# Patient Record
Sex: Female | Born: 1965 | Race: Black or African American | Hispanic: No | Marital: Single | Smoking: Current every day smoker
Health system: Southern US, Community
[De-identification: ages and names within clinical notes are randomized; demographics above are authoritative.]

## PROBLEM LIST (undated history)

## (undated) DIAGNOSIS — K589 Irritable bowel syndrome without diarrhea: Secondary | ICD-10-CM

## (undated) HISTORY — PX: CHOLECYSTECTOMY: SHX55

---

## 2015-01-10 ENCOUNTER — Encounter (HOSPITAL_BASED_OUTPATIENT_CLINIC_OR_DEPARTMENT_OTHER): Payer: Self-pay | Admitting: *Deleted

## 2015-01-10 ENCOUNTER — Emergency Department (HOSPITAL_BASED_OUTPATIENT_CLINIC_OR_DEPARTMENT_OTHER): Payer: Managed Care, Other (non HMO)

## 2015-01-10 ENCOUNTER — Emergency Department (HOSPITAL_BASED_OUTPATIENT_CLINIC_OR_DEPARTMENT_OTHER)
Admission: EM | Admit: 2015-01-10 | Discharge: 2015-01-10 | Disposition: A | Discharge status: Home / Self Care | Payer: Managed Care, Other (non HMO) | Attending: Emergency Medicine | Admitting: Emergency Medicine

## 2015-01-10 DIAGNOSIS — Y998 Other external cause status: Secondary | ICD-10-CM | POA: Diagnosis not present

## 2015-01-10 DIAGNOSIS — S3992XA Unspecified injury of lower back, initial encounter: Secondary | ICD-10-CM | POA: Insufficient documentation

## 2015-01-10 DIAGNOSIS — Y92410 Unspecified street and highway as the place of occurrence of the external cause: Secondary | ICD-10-CM | POA: Insufficient documentation

## 2015-01-10 DIAGNOSIS — Y9389 Activity, other specified: Secondary | ICD-10-CM | POA: Insufficient documentation

## 2015-01-10 DIAGNOSIS — S199XXA Unspecified injury of neck, initial encounter: Secondary | ICD-10-CM | POA: Insufficient documentation

## 2015-01-10 DIAGNOSIS — M542 Cervicalgia: Secondary | ICD-10-CM

## 2015-01-10 MED ORDER — IBUPROFEN 800 MG PO TABS
800.0000 mg | ORAL_TABLET | Freq: Once | ORAL | Status: AC
  Administered 2015-01-10: 800 mg via ORAL
  Filled 2015-01-10: qty 1

## 2015-01-10 MED ORDER — IBUPROFEN 800 MG PO TABS: 800.0000 mg | ORAL_TABLET | Freq: Once | ORAL | Status: DC

## 2015-01-10 MED ORDER — HYDROCODONE-ACETAMINOPHEN 5-325 MG PO TABS: 1.0000 | ORAL_TABLET | Freq: Four times a day (QID) | ORAL | Status: DC | PRN

## 2015-01-10 NOTE — Discharge Instructions (Signed)
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
## 2015-01-10 NOTE — ED Notes (Signed)
Patient transported to X-ray via stretcher, sr x 2 up, warm blanket provided

## 2015-01-10 NOTE — ED Notes (Signed)
MD at bedside. 

## 2018-01-04 ENCOUNTER — Encounter (HOSPITAL_BASED_OUTPATIENT_CLINIC_OR_DEPARTMENT_OTHER): Payer: Self-pay

## 2018-01-04 ENCOUNTER — Emergency Department (HOSPITAL_BASED_OUTPATIENT_CLINIC_OR_DEPARTMENT_OTHER): Payer: 59

## 2018-01-04 ENCOUNTER — Emergency Department (HOSPITAL_BASED_OUTPATIENT_CLINIC_OR_DEPARTMENT_OTHER)
Admission: EM | Admit: 2018-01-04 | Discharge: 2018-01-04 | Disposition: A | Discharge status: Home / Self Care | Payer: 59 | Attending: Emergency Medicine | Admitting: Emergency Medicine

## 2018-01-04 ENCOUNTER — Other Ambulatory Visit: Payer: Self-pay

## 2018-01-04 DIAGNOSIS — J0100 Acute maxillary sinusitis, unspecified: Secondary | ICD-10-CM

## 2018-01-04 DIAGNOSIS — R0981 Nasal congestion: Secondary | ICD-10-CM | POA: Diagnosis present

## 2018-01-04 HISTORY — DX: Irritable bowel syndrome, unspecified: K58.9

## 2018-01-04 MED ORDER — FLUTICASONE PROPIONATE 50 MCG/ACT NA SUSP: 1.0000 | Freq: Every day | NASAL | 0 refills | Status: DC

## 2018-01-04 MED ORDER — AMOXICILLIN-POT CLAVULANATE 875-125 MG PO TABS: 1.0000 | ORAL_TABLET | Freq: Two times a day (BID) | ORAL | 0 refills | Status: DC

## 2018-01-04 MED FILL — AMOX-CLAV 875-125 MG TABLET: 875-125 | 7 days supply | Qty: 14 | Fill #0

## 2018-01-04 MED FILL — SM ALLERGY RELIEF 50 MCG SP: 50 MCG | 59 days supply | Qty: 16 | Fill #0

## 2018-01-04 NOTE — ED Provider Notes (Signed)
[facility] EMERGENCY DEPARTMENT Provider Note   CSN: [id number] Arrival date & time: 01/04/18  1255     History   Chief Complaint Chief Complaint  Patient presents with  . URI    HPI Karen Mcdowell is a 52 y.o. female with a hx of IBS and cholecystectomy who presents to the ED with complaints of URI sxs x 1-2 weeks. Patient states she is having congestion, rhinorrhea, L sided sinus pain/pressure, fever with temp max of 101, chills, and mild dry cough.  Symptoms are constant.  States she has tried over-the-counter sinus medicines without relief.  No specific alleviating or aggravating factors.  Denies ear pain, sore throat, difficulty breathing, rashes, or recent tick exposures/wooded activity.  HPI  Past Medical History:  Diagnosis Date  . IBS (irritable bowel syndrome)     There are no active problems to display for this patient.   Past Surgical History:  Procedure Laterality Date  . CHOLECYSTECTOMY       OB History   None      Home Medications    Prior to Admission medications   Medication Sig Start Date End Date Taking? Authorizing Provider  HYDROcodone-acetaminophen (NORCO/VICODIN) 5-325 MG per tablet Take 1 tablet by mouth every 6 (six) hours as needed for moderate pain. 01/10/15   Elwin MochaWalden, Blair, MD  ibuprofen (ADVIL,MOTRIN) 800 MG tablet Take 1 tablet (800 mg total) by mouth once. 01/10/15   Elwin MochaWalden, Blair, MD    Family History No family history on file.  Social History Social History   Tobacco Use  . Smoking status: Never Smoker  . Smokeless tobacco: Never Used  Substance Use Topics  . Alcohol use: No    Frequency: Never  . Drug use: Yes    Types: Marijuana     Allergies   Patient has no known allergies.   Review of Systems Review of Systems  Constitutional: Positive for fever.  HENT: Positive for congestion, rhinorrhea, sinus pressure and sinus pain. Negative for ear pain and sore throat.   Respiratory: Positive for cough. Negative  for shortness of breath.   Skin: Negative for rash.     Physical Exam Updated Vital Signs BP 138/90 (BP Location: Left Arm)   Pulse 87   Temp 99.4 F (37.4 C) (Oral)   Resp 20   Ht 5\' 3"  (1.6 m)   Wt 62.1 kg (137 lb)   SpO2 100%   BMI 24.27 kg/m   Physical Exam  Constitutional: She appears well-developed and well-nourished.  Non-toxic appearance. No distress.  HENT:  Head: Normocephalic and atraumatic.  Right Ear: Tympanic membrane normal. No mastoid tenderness. Tympanic membrane is not perforated, not erythematous, not retracted and not bulging.  Left Ear: Tympanic membrane normal. No mastoid tenderness. Tympanic membrane is not perforated, not erythematous, not retracted and not bulging.  Nose: Mucosal edema present. Right sinus exhibits no maxillary sinus tenderness and no frontal sinus tenderness. Left sinus exhibits maxillary sinus tenderness and frontal sinus tenderness.  Mouth/Throat: Uvula is midline and oropharynx is clear and moist. No oropharyngeal exudate or posterior oropharyngeal erythema.  Eyes: Pupils are equal, round, and reactive to light. Conjunctivae and EOM are normal. Right eye exhibits no discharge. Left eye exhibits no discharge.  Neck: Normal range of motion. Neck supple. No neck rigidity.  Cardiovascular: Normal rate and regular rhythm.  No murmur heard. Pulmonary/Chest: Breath sounds normal. No respiratory distress. She has no wheezes. She has no rales.  Abdominal: Soft. She exhibits no distension. There is  no tenderness.  Lymphadenopathy:    She has no cervical adenopathy.  Neurological: She is alert.  Skin: Skin is warm and dry. No rash noted.  Psychiatric: She has a normal mood and affect. Her behavior is normal.  Nursing note and vitals reviewed.  ED Treatments / Results  Labs (all labs ordered are listed, but only abnormal results are displayed) Labs Reviewed - No data to display  EKG None  Radiology Dg Chest 2 View  Result Date:  01/04/2018 CLINICAL DATA:  Two weeks of sinus congestion associated with fever and cough. EXAM: CHEST - 2 VIEW COMPARISON:  Report of a chest x-ray of August 29, 2006 FINDINGS: The lungs are adequately inflated and clear. The heart and pulmonary vascularity are normal. The mediastinum is normal in width. The trachea is midline. There is no pleural effusion. The bony thorax exhibits no acute abnormality. IMPRESSION: There is no active cardiopulmonary disease. Electronically Signed   By: David  [place] M.D.   On: 01/04/2018 14:29    Procedures Procedures (including critical care time)  Medications Ordered in ED Medications - No data to display   Initial Impression / Assessment and Plan / ED Course  I have reviewed the triage vital signs and the nursing notes.  Pertinent labs & imaging results that were available during my care of the patient were reviewed by me and considered in my medical decision making (see chart for details).   Patient presents with URI symptoms for the past 1 to 2 weeks.  Patient nontoxic-appearing, in no apparent distress, vitals WNL.  Lungs clear to auscultation bilaterally, chest x-ray obtained and negative for infiltrate, doubt pneumonia.  Oropharynx is clear, doubt strep pharyngitis.  No meningeal signs. No rashes or tic exposures to raise concern for tic borne illness. Given patient's symptoms have been ongoing for 1 to 2 weeks, she is having left-sided maxillary/frontal tenderness to palpation, and she has had measured fevers at home feel that treatment for acute bacterial sinusitis with Augmentin is appropriate, will also give prescription for flonase. I discussed results, treatment plan, need for PCP follow-up, and return precautions with the patient. Provided opportunity for questions, patient confirmed understanding and is in agreement with plan.    Final Clinical Impressions(s) / ED Diagnoses   Final diagnoses:  Acute non-recurrent maxillary sinusitis    ED  Discharge Orders        Ordered    amoxicillin-clavulanate (AUGMENTIN) 875-125 MG tablet  Every 12 hours     01/04/18 1514    fluticasone (FLONASE) 50 MCG/ACT nasal spray  Daily     01/04/18 [address] Pleas Koch, PA-C 01/04/18 1532    Vanetta Mulders, MD 01/04/18 1639

## 2018-01-04 NOTE — Discharge Instructions (Signed)
You are seen in the emergency department today and diagnosed with sinusitis.  Your chest x-ray was normal.  We are treating your sinus infection with an antibiotic, Augmentin. Please take all of your antibiotics until finished. You may develop abdominal discomfort or diarrhea from the antibiotic.  You may help offset this with probiotics which you can buy at the store (ask your pharmacist if unable to find) or get probiotics in the form of eating yogurt. Do not eat or take the probiotics until 2 hours after your antibiotic. If you are unable to tolerate these side effects follow-up with your primary care provider or return to the emergency department.   If you begin to experience any blistering, rashes, swelling, or difficulty breathing seek medical care for evaluation of potentially more serious side effects.   Please be aware that this medication may interact with other medications you are taking, please be sure to discuss your medication list with your pharmacist.   We are also sending you home with a prescription for Flonase, this is an intranasal steroid to help with nasal congestion.  Please use Flonase daily in each nostril.  Follow-up with your primary care provider in 1 week for reevaluation.  Return to the ER at anytime for new or worsening symptoms or any other concerns.

## 2018-01-04 NOTE — ED Triage Notes (Signed)
C/o sinus congestion, chills x 2 weeks- NAD-steady  gait

## 2020-03-17 ENCOUNTER — Other Ambulatory Visit: Payer: 59

## 2020-04-18 ENCOUNTER — Emergency Department (HOSPITAL_BASED_OUTPATIENT_CLINIC_OR_DEPARTMENT_OTHER): Payer: Self-pay

## 2020-04-18 ENCOUNTER — Other Ambulatory Visit: Payer: Self-pay

## 2020-04-18 ENCOUNTER — Encounter (HOSPITAL_BASED_OUTPATIENT_CLINIC_OR_DEPARTMENT_OTHER): Payer: Self-pay

## 2020-04-18 ENCOUNTER — Inpatient Hospital Stay (HOSPITAL_BASED_OUTPATIENT_CLINIC_OR_DEPARTMENT_OTHER)
Admission: EM | Admit: 2020-04-18 | Discharge: 2020-04-19 | DRG: 101 | Disposition: A | Discharge status: Home / Self Care | Payer: Self-pay | Attending: Internal Medicine | Admitting: Internal Medicine

## 2020-04-18 DIAGNOSIS — F17290 Nicotine dependence, other tobacco product, uncomplicated: Secondary | ICD-10-CM | POA: Diagnosis present

## 2020-04-18 DIAGNOSIS — R569 Unspecified convulsions: Principal | ICD-10-CM

## 2020-04-18 DIAGNOSIS — R030 Elevated blood-pressure reading, without diagnosis of hypertension: Secondary | ICD-10-CM | POA: Diagnosis present

## 2020-04-18 DIAGNOSIS — Z20822 Contact with and (suspected) exposure to covid-19: Secondary | ICD-10-CM | POA: Diagnosis present

## 2020-04-18 DIAGNOSIS — D72829 Elevated white blood cell count, unspecified: Secondary | ICD-10-CM | POA: Diagnosis present

## 2020-04-18 LAB — I-STAT CHEM 8, ED
BUN: 12 mg/dL (ref 6–20)
Calcium, Ion: 1.18 mmol/L (ref 1.15–1.40)
Chloride: 102 mmol/L (ref 98–111)
Creatinine, Ser: 1 mg/dL (ref 0.44–1.00)
Glucose, Bld: 108 mg/dL — ABNORMAL HIGH (ref 70–99)
HCT: 39 % (ref 36.0–46.0)
Hemoglobin: 13.3 g/dL (ref 12.0–15.0)
Potassium: 4.2 mmol/L (ref 3.5–5.1)
Sodium: 138 mmol/L (ref 135–145)
TCO2: 28 mmol/L (ref 22–32)

## 2020-04-18 LAB — COMPREHENSIVE METABOLIC PANEL
ALT: 21 U/L (ref 0–44)
AST: 26 U/L (ref 15–41)
Albumin: 4.2 g/dL (ref 3.5–5.0)
Alkaline Phosphatase: 67 U/L (ref 38–126)
Anion gap: 9 (ref 5–15)
BUN: 13 mg/dL (ref 6–20)
CO2: 27 mmol/L (ref 22–32)
Calcium: 9.3 mg/dL (ref 8.9–10.3)
Chloride: 102 mmol/L (ref 98–111)
Creatinine, Ser: 0.83 mg/dL (ref 0.44–1.00)
GFR, Estimated: >60 mL/min (ref >60)
Glucose, Bld: 104 mg/dL — ABNORMAL HIGH (ref 70–99)
Potassium: 4 mmol/L (ref 3.5–5.1)
Sodium: 138 mmol/L (ref 135–145)
Total Bilirubin: 0.2 mg/dL — ABNORMAL LOW (ref 0.3–1.2)
Total Protein: 7.1 g/dL (ref 6.5–8.1)

## 2020-04-18 LAB — URINALYSIS, ROUTINE W REFLEX MICROSCOPIC
Bilirubin Urine: NEGATIVE
Glucose, UA: NEGATIVE mg/dL
Ketones, ur: NEGATIVE mg/dL
Leukocytes,Ua: NEGATIVE
Nitrite: NEGATIVE
Protein, ur: NEGATIVE mg/dL
Specific Gravity, Urine: 1.02 (ref 1.005–1.030)
pH: 7 (ref 5.0–8.0)

## 2020-04-18 LAB — CBC WITH DIFFERENTIAL/PLATELET
Abs Immature Granulocytes: 0.03 10*3/uL (ref 0.00–0.07)
Basophils Absolute: 0.1 10*3/uL (ref 0.0–0.1)
Basophils Relative: 0 %
Eosinophils Absolute: 0 10*3/uL (ref 0.0–0.5)
Eosinophils Relative: 0 %
HCT: 40.6 % (ref 36.0–46.0)
Hemoglobin: 13.6 g/dL (ref 12.0–15.0)
Immature Granulocytes: 0 %
Lymphocytes Relative: 30 %
Lymphs Abs: 3.9 10*3/uL (ref 0.7–4.0)
MCH: 31.2 pg (ref 26.0–34.0)
MCHC: 33.5 g/dL (ref 30.0–36.0)
MCV: 93.1 fL (ref 80.0–100.0)
Monocytes Absolute: 0.5 10*3/uL (ref 0.1–1.0)
Monocytes Relative: 4 %
Neutro Abs: 8.6 10*3/uL — ABNORMAL HIGH (ref 1.7–7.7)
Neutrophils Relative %: 66 %
Platelets: 321 10*3/uL (ref 150–400)
RBC: 4.36 MIL/uL (ref 3.87–5.11)
RDW: 12.4 % (ref 11.5–15.5)
WBC: 13.1 10*3/uL — ABNORMAL HIGH (ref 4.0–10.5)
nRBC: 0 % (ref 0.0–0.2)

## 2020-04-18 LAB — MAGNESIUM: Magnesium: 2.4 mg/dL (ref 1.7–2.4)

## 2020-04-18 LAB — CBG MONITORING, ED: Glucose-Capillary: 137 mg/dL — ABNORMAL HIGH (ref 70–99)

## 2020-04-18 LAB — RAPID URINE DRUG SCREEN, HOSP PERFORMED
Amphetamines: NOT DETECTED
Barbiturates: NOT DETECTED
Benzodiazepines: NOT DETECTED
Cocaine: NOT DETECTED
Opiates: NOT DETECTED
Tetrahydrocannabinol: POSITIVE — AB

## 2020-04-18 LAB — URINALYSIS, MICROSCOPIC (REFLEX)

## 2020-04-18 LAB — ETHANOL: Alcohol, Ethyl (B): <10 mg/dL (ref <10)

## 2020-04-18 LAB — RESPIRATORY PANEL BY RT PCR (FLU A&B, COVID)
Influenza A by PCR: NEGATIVE
Influenza B by PCR: NEGATIVE
SARS Coronavirus 2 by RT PCR: NEGATIVE

## 2020-04-18 MED ORDER — LORAZEPAM 2 MG/ML IJ SOLN
4.0000 mg | INTRAMUSCULAR | Status: AC
  Filled 2020-04-18: qty 2

## 2020-04-18 MED ORDER — FLUTICASONE PROPIONATE 50 MCG/ACT NA SUSP
1.0000 | Freq: Every day | NASAL | Status: DC
  Filled 2020-04-18: qty 16

## 2020-04-18 MED ORDER — ENOXAPARIN SODIUM 40 MG/0.4ML ~~LOC~~ SOLN
40.0000 mg | SUBCUTANEOUS | Status: DC
  Administered 2020-04-19: 40 mg via SUBCUTANEOUS
  Filled 2020-04-18: qty 0.4

## 2020-04-18 MED ORDER — SODIUM CHLORIDE 0.9 % IV SOLN
75.0000 mL/h | INTRAVENOUS | Status: DC
  Administered 2020-04-18: 75 mL/h via INTRAVENOUS

## 2020-04-18 MED ORDER — LORAZEPAM 2 MG/ML IJ SOLN: 1.0000 mg | INTRAMUSCULAR | Status: DC | PRN

## 2020-04-18 MED ORDER — LORAZEPAM 2 MG/ML IJ SOLN: 2.0000 mg | Freq: Once | INTRAMUSCULAR | Status: AC

## 2020-04-18 MED ORDER — LEVETIRACETAM 500 MG PO TABS
500.0000 mg | ORAL_TABLET | Freq: Two times a day (BID) | ORAL | Status: DC
  Administered 2020-04-19: 500 mg via ORAL
  Filled 2020-04-18: qty 1

## 2020-04-18 MED ORDER — LORAZEPAM 2 MG/ML IJ SOLN
INTRAMUSCULAR | Status: AC
  Administered 2020-04-18: 2 mg via INTRAVENOUS
  Filled 2020-04-18: qty 1

## 2020-04-18 MED ORDER — LEVETIRACETAM 500 MG PO TABS: 500.0000 mg | ORAL_TABLET | Freq: Two times a day (BID) | ORAL | Status: DC

## 2020-04-18 MED ORDER — LEVETIRACETAM IN NACL 1000 MG/100ML IV SOLN
1000.0000 mg | Freq: Once | INTRAVENOUS | Status: AC
  Administered 2020-04-18: 1000 mg via INTRAVENOUS
  Filled 2020-04-18: qty 100

## 2020-04-18 NOTE — ED Notes (Signed)
Family informed of fall [place]

## 2020-04-18 NOTE — ED Notes (Signed)
Belongings givenm to family member [place]. Shirt , bra  ,  Red purse

## 2020-04-18 NOTE — ED Notes (Signed)
ED Provider at bedside. 

## 2020-04-18 NOTE — ED Notes (Signed)
amb to [facility] for u/a 

## 2020-04-18 NOTE — ED Notes (Signed)
Pt states she doesn't have to pee right now

## 2020-04-18 NOTE — ED Notes (Signed)
Family at bedside. 

## 2020-04-18 NOTE — ED Notes (Signed)
Assumed care of patient from Andrea, RN.

## 2020-04-18 NOTE — ED Notes (Signed)
Patient transported to CT 

## 2020-04-18 NOTE — ED Notes (Addendum)
Tech heard a loud noise from room, attempted to open door , pt laying infront of door, pt moved from door and appeared to be seizing , unresp, nonverbal, and drooling. Pt rolled over onto right side . Lifted back to stretcher. MD at bedside, ativan 2mg  given. Seizure lasting approx 3 mins , VSS, small abrasion to right lower lip. No other injuries noted.

## 2020-04-18 NOTE — ED Notes (Signed)
Report given to receiving RN.

## 2020-04-18 NOTE — ED Provider Notes (Signed)
[facility] EMERGENCY DEPARTMENT Provider Note   CSN: [id number] Arrival date & time: 04/18/20  1108     History Chief Complaint  Patient presents with  . Loss of Consciousness    Karen Mcdowell is a 54 y.o. female.  The history is provided by the patient and medical records.  Loss of Consciousness  Karen Mcdowell is a 54 y.o. female who presents to the Emergency Department complaining of possible seizure. She presents the emergency department by private vehicle after waking up on the floor in her bedroom. She went to bed around 1230 in the morning. When she woke up on the floor her niece was screaming was told she had a seizure. Her niece is about 85 and is not available to provide any description of the event. She denies any current complaints aside from feeling tired. No loss of bowel or bladder. No tongue biting. No recent illnesses or injuries. She has no pain. She does use marijuana use marijuana last night. No history of seizure disorder. Denies any alcohol use. No prior similar symptoms.    Past Medical History:  Diagnosis Date  . IBS (irritable bowel syndrome)     Patient Active Problem List   Diagnosis Date Noted  . Seizures (HCC) 04/18/2020    Past Surgical History:  Procedure Laterality Date  . CHOLECYSTECTOMY       OB History   No obstetric history on file.     No family history on file.  Social History   Tobacco Use  . Smoking status: Current Every Day Smoker    Types: Cigars  . Smokeless tobacco: Never Used  Vaping Use  . Vaping Use: Never used  Substance Use Topics  . Alcohol use: No  . Drug use: Yes    Types: Marijuana    Comment: daily    Home Medications Prior to Admission medications   Medication Sig Start Date End Date Taking? Authorizing Provider  amoxicillin-clavulanate (AUGMENTIN) 875-125 MG tablet Take 1 tablet by mouth every 12 (twelve) hours. 01/04/18   Petrucelli, Samantha R, PA-C  fluticasone (FLONASE) 50 MCG/ACT nasal  spray Place 1 spray into both nostrils daily. 01/04/18   Petrucelli, Samantha R, PA-C  HYDROcodone-acetaminophen (NORCO/VICODIN) 5-325 MG per tablet Take 1 tablet by mouth every 6 (six) hours as needed for moderate pain. 01/10/15   Elwin Mocha, MD  ibuprofen (ADVIL,MOTRIN) 800 MG tablet Take 1 tablet (800 mg total) by mouth once. 01/10/15   Elwin Mocha, MD    Allergies    Patient has no known allergies.  Review of Systems   Review of Systems  Cardiovascular: Positive for syncope.  All other systems reviewed and are negative.   Physical Exam Updated Vital Signs BP (!) 129/92 (BP Location: Right Arm)   Pulse 88   Temp 98 F (36.7 C) (Oral)   Resp 20   Ht 5\' 3"  (1.6 m)   Wt 68 kg   SpO2 98%   BMI 26.57 kg/m   Physical Exam Vitals and nursing note reviewed.  Constitutional:      Appearance: She is well-developed.  HENT:     Head: Normocephalic and atraumatic.  Eyes:     Extraocular Movements: Extraocular movements intact.     Pupils: Pupils are equal, round, and reactive to light.  Cardiovascular:     Rate and Rhythm: Normal rate and regular rhythm.     Heart sounds: No murmur heard.   Pulmonary:     Effort: Pulmonary effort is normal. No  respiratory distress.     Breath sounds: Normal breath sounds.  Abdominal:     Palpations: Abdomen is soft.     Tenderness: There is no abdominal tenderness. There is no guarding or rebound.  Musculoskeletal:        General: No tenderness.  Skin:    General: Skin is warm and dry.  Neurological:     Mental Status: She is alert and oriented to person, place, and time.     Comments: No asymmetry of facial movements. Visual fields grossly intact. Five out of five strength in all four extremities with sensation light touch intact in all four extremities.  Psychiatric:        Behavior: Behavior normal.     ED Results / Procedures / Treatments   Labs (all labs ordered are listed, but only abnormal results are displayed) Labs  Reviewed  COMPREHENSIVE METABOLIC PANEL - Abnormal; Notable for the following components:      Result Value   Glucose, Bld 104 (*)    Total Bilirubin 0.2 (*)    All other components within normal limits  CBC WITH DIFFERENTIAL/PLATELET - Abnormal; Notable for the following components:   WBC 13.1 (*)    Neutro Abs 8.6 (*)    All other components within normal limits  URINALYSIS, ROUTINE W REFLEX MICROSCOPIC - Abnormal; Notable for the following components:   Hgb urine dipstick SMALL (*)    All other components within normal limits  RAPID URINE DRUG SCREEN, HOSP PERFORMED - Abnormal; Notable for the following components:   Tetrahydrocannabinol POSITIVE (*)    All other components within normal limits  URINALYSIS, MICROSCOPIC (REFLEX) - Abnormal; Notable for the following components:   Bacteria, UA RARE (*)    All other components within normal limits  I-STAT CHEM 8, ED - Abnormal; Notable for the following components:   Glucose, Bld 108 (*)    All other components within normal limits  CBG MONITORING, ED - Abnormal; Notable for the following components:   Glucose-Capillary 137 (*)    All other components within normal limits  ETHANOL  MAGNESIUM    EKG EKG Interpretation  Date/Time:  Friday April 18 2020 12:48:55 EDT Ventricular Rate:  85 PR Interval:    QRS Duration: 88 QT Interval:  373 QTC Calculation: 444 R Axis:   80 Text Interpretation: Sinus rhythm Probable left atrial enlargement Confirmed by Tilden Fossa [phone]) on 04/18/2020 1:05:17 PM   Radiology CT Head Wo Contrast  Result Date: 04/18/2020 CLINICAL DATA:  Nontraumatic seizure EXAM: CT HEAD WITHOUT CONTRAST TECHNIQUE: Contiguous axial images were obtained from the base of the skull through the vertex without intravenous contrast. Sagittal and coronal MPR images reconstructed from axial data set. COMPARISON:  None FINDINGS: Brain: Normal ventricular morphology. No midline shift or mass effect. Normal  appearance of brain parenchyma. No intracranial hemorrhage, mass lesion or evidence of acute infarction. No extra-axial fluid collections. Vascular: Unremarkable.  No hyperdense vessels. Skull: Intact Sinuses/Orbits: Clear Other: N/A IMPRESSION: Normal exam. Electronically Signed   By: Ulyses Southward M.D.   On: 04/18/2020 13:27    Procedures Procedures (including critical care time)  Medications Ordered in ED Medications  levETIRAcetam (KEPPRA) tablet 500 mg (has no administration in time range)  LORazepam (ATIVAN) injection 2 mg (2 mg Intravenous Given 04/18/20 1422)  levETIRAcetam (KEPPRA) IVPB 1000 mg/100 mL premix (0 mg Intravenous Stopped 04/18/20 1454)  levETIRAcetam (KEPPRA) IVPB 1000 mg/100 mL premix (1,000 mg Intravenous New Bag/Given 04/18/20 1524)    ED  Course  I have reviewed the triage vital signs and the nursing notes.  Pertinent labs & imaging results that were available during my care of the patient were reviewed by me and considered in my medical decision making (see chart for details).    MDM Rules/Calculators/A&P                          Patient here for evaluation of possible seizure activity. On ED presentation patient is well appearing with no focal neurologic deficits. CT scan without acute abnormality. No significant electrolyte abnormalities. While awaiting ED workup patient was in the room with the door closed. An ED tech heard gurgling noise followed by a thud and she was found on the floor behind the door. On staff assessing her she had seizure activity with eyes and head deviated to the right with drawing up of bilateral upper extremities. Activity lasted approximately one minute. She was treated with Ativan. Following Ativan administration patient very lethargic, moves all extremities purposefully but is not following commands. Discussed with Dr. Wilford Corner with neurology. He recommends Keppra load of 2 g followed by 500 PO BID. He also recommends admission to [facility]  for MRI and EEG.  Final Clinical Impression(s) / ED Diagnoses Final diagnoses:  Seizure [facility])    Rx / DC Orders ED Discharge Orders    None       Tilden Fossa, MD 04/18/20 1611

## 2020-04-18 NOTE — ED Notes (Signed)
Pt aware of urine needed. Pt unable to urinate at this time 

## 2020-04-18 NOTE — ED Notes (Signed)
Departs ED with [facility] at this time. Awaiting phone call from receiving RN for report.

## 2020-04-18 NOTE — H&P (Signed)
History and Physical   Alessa Mazur NUU:[id number] DOB: [birth year] DOA: 04/18/2020  PCP: Lynnea Ferrier., MD  Patient coming from: home  I have personally briefly reviewed patient's old medical records in [facility] EMR.  Chief Concern: seizure  HPI: Karen Mcdowell is a 54 y.o. female with medical history of IBS, cholecystectomy,, acute nonrecurrent maxillary sinusitis, history of acute right knee pain andwith referral to orthopedic surgeon in 2019 presents to med [facility] for chief concern of possible seizure.  Per medicine ED note and neurology note patient was found on the floor with her niece screaming that the patient had a seizure.  No loss of bowel, bladder, tongue biting.  No history of alcohol use, no prior CNS surgery, no prior CNS infection, no personal history of seizure, and no family history of seizure.  She was in the process of being discharged from the emergency department admit center when she had 3-minute episode of all extremity shaking concerning for seizure.  She was given 2 mg of Ativan IV and noted to have a small abrasion on her right lower lip.  She was loaded with Keppra 2 g and started on maintenance Keppra 500 mg twice daily.  In the emergency department at med center, ED provider called called neurology where she was recommended to be transferred to [facility] for further work-up, EEG, and MRI of the brain.  At bedside Ms. Reeve [place] appears lethargic.  She is arousable verbally and awake and oriented to self, age, current location of [facility], and current year.  Review of system was negative for headache, vision changes, dysphagia, odynophagia, nausea, vomiting, chest pain, shortness of breath, abdominal pain, dysuria, hematuria, diarrhea, constipation, suicidal ideation, suicidal ideation, appetite loss, fever, chills, cough.  She endorsed feeling tired and sleepy.  She denies ever having seizures in the past and denies comments from  family of patient staring off into space.  Patient denies family history of seizure.  Social history: She denies tobacco use, EtOH, and recreational drug use  ED Course: She was given 1 dose of Ativan 2 mg IV at approximately 1422 on 04/18/2020, loading Keppra totaling 2 g IV, and started normal saline infusion at 75 cc/h.  Review of Systems: As per HPI otherwise 10 point review of systems negative.   Past Medical History:  Diagnosis Date  . IBS (irritable bowel syndrome)     Past Surgical History:  Procedure Laterality Date  . CHOLECYSTECTOMY     Social History:  reports that she has been smoking cigars. She has never used smokeless tobacco. She reports current drug use. Drug: Marijuana. She reports that she does not drink alcohol.  No Known Allergies  No family history on file. Family history: Family history reviewed and no family history of seizure.  Prior to Admission medications   Medication Sig Start Date End Date Taking? Authorizing Provider  amoxicillin-clavulanate (AUGMENTIN) 875-125 MG tablet Take 1 tablet by mouth every 12 (twelve) hours. 01/04/18   Petrucelli, Samantha R, PA-C  fluticasone (FLONASE) 50 MCG/ACT nasal spray Place 1 spray into both nostrils daily. 01/04/18   Petrucelli, Samantha R, PA-C  HYDROcodone-acetaminophen (NORCO/VICODIN) 5-325 MG per tablet Take 1 tablet by mouth every 6 (six) hours as needed for moderate pain. 01/10/15   Elwin Mocha, MD  ibuprofen (ADVIL,MOTRIN) 800 MG tablet Take 1 tablet (800 mg total) by mouth once. 01/10/15   Elwin Mocha, MD   Physical Exam: Vitals:   04/18/20 2028 04/18/20 2120 04/18/20 2346  04/19/20 0326  BP: 104/74 106/66 (!) 135/43 114/76  Pulse: 87 77 (!) 102 84  Resp: 18 18 18 17   Temp:  99 F (37.2 C) 100.2 F (37.9 C) 99.3 F (37.4 C)  TempSrc:  Oral Oral Oral  SpO2: 98% 100% 99% 99%  Weight:      Height:       Constitutional: NAD, calm, sleepy Eyes: PERRL, lids and conjunctivae normal ENMT: Mucous  membranes are moist. Posterior pharynx clear of any exudate or lesions.Normal dentition.  Neck: normal, supple, no masses, no thyromegaly Respiratory: clear to auscultation bilaterally, no wheezing, no crackles. Normal respiratory effort. No accessory muscle use.  Cardiovascular: Regular rate and rhythm, no murmurs / rubs / gallops. No extremity edema. 2+ pedal pulses. No carotid bruits.  Abdomen: obese abdomen, no tenderness, no masses palpated. No hepatosplenomegaly. Bowel sounds positive.  Musculoskeletal: no clubbing / cyanosis. No joint deformity upper and lower extremities. Good ROM, no contractures. Normal muscle tone.  Skin: no rashes, lesions, ulcers. No induration Neurologic: CN 2-12 grossly intact. Sensation intact, Strength 5/5 in all 4.  Psychiatric: Normal judgment and insight. Alert and oriented x 3. Normal mood.   Labs on Admission: I have personally reviewed following labs and imaging studies  CBC: Recent Labs  Lab 04/18/20 1135 04/18/20 1244 04/19/20 0203  WBC 13.1*  --  13.7*  NEUTROABS 8.6*  --   --   HGB 13.6 13.3 13.1  HCT 40.6 39.0 38.9  MCV 93.1  --  92.0  PLT 321  --  318   Basic Metabolic Panel: Recent Labs  Lab 04/18/20 1135 04/18/20 1244  NA 138 138  K 4.0 4.2  CL 102 102  CO2 27  --   GLUCOSE 104* 108*  BUN 13 12  CREATININE 0.83 1.00  CALCIUM 9.3  --   MG 2.4  --    GFR: Estimated Creatinine Clearance: 60.9 mL/min (by C-G formula based on SCr of 1 mg/dL). Liver Function Tests: Recent Labs  Lab 04/18/20 1135  AST 26  ALT 21  ALKPHOS 67  BILITOT 0.2*  PROT 7.1  ALBUMIN 4.2   CBG: Recent Labs  Lab 04/18/20 1418  GLUCAP 137*   Urine analysis:    Component Value Date/Time   COLORURINE YELLOW 04/18/2020 1334   APPEARANCEUR CLEAR 04/18/2020 1334   LABSPEC 1.020 04/18/2020 1334   PHURINE 7.0 04/18/2020 1334   GLUCOSEU NEGATIVE 04/18/2020 1334   HGBUR SMALL (A) 04/18/2020 1334   BILIRUBINUR NEGATIVE 04/18/2020 1334   KETONESUR  NEGATIVE 04/18/2020 1334   PROTEINUR NEGATIVE 04/18/2020 1334   NITRITE NEGATIVE 04/18/2020 1334   LEUKOCYTESUR NEGATIVE 04/18/2020 1334   Radiological Exams on Admission: Personally reviewed and I agree with radiologist reading as below.  CT Head Wo Contrast  Result Date: 04/18/2020 CLINICAL DATA:  Nontraumatic seizure EXAM: CT HEAD WITHOUT CONTRAST TECHNIQUE: Contiguous axial images were obtained from the base of the skull through the vertex without intravenous contrast. Sagittal and coronal MPR images reconstructed from axial data set. COMPARISON:  None FINDINGS: Brain: Normal ventricular morphology. No midline shift or mass effect. Normal appearance of brain parenchyma. No intracranial hemorrhage, mass lesion or evidence of acute infarction. No extra-axial fluid collections. Vascular: Unremarkable.  No hyperdense vessels. Skull: Intact Sinuses/Orbits: Clear Other: N/A IMPRESSION: Normal exam. Electronically Signed   By: 04/20/2020 M.D.   On: 04/18/2020 13:27   EKG: Independently reviewed, showing normal sinus rhythm, no peak t wave  Assessment/Plan  Principal Problem:  Seizures (HCC) Active Problems:   Seizure (HCC)   Leukocytosis   # Seizure-first episode, etiology unknown at this time -Telemetry neurology has been consulted  - AM provider to call inpatient neurology if feel is indicated - Urine drug screen is positive for THC - EEG has been ordered - MRI of the brain without contrast with thin slices through the temporal lobe - No driving until free of any seizure for 6 months and has to be cleared by neurology before she can resume driving - Admit to medical telemetry with seizure precautions - Continue Keppra 500 mg twice daily - Continue Ativan 2 mg as needed for seizures - Seizure precaution - Check TSH, HIV  # Hypertension suspected secondary to seizure activity - Currently normotensive and per patient, no personal history of hypertension - Labetalol prn   #  Leukocytosis - suspect reactive as patient has no fever and other physical exam evidence of infection  - UA negative  DVT prophylaxis: enoxaparin Code Status: FULL Diet: NPO Family Communication: none Disposition Plan: pending clinical course and MRI, EEG Consults called: tele neurology Admission status: obs with telemetry  Wynston Romey N Jennamarie Goings D.O. [facility]  If 7AM-7PM, please contact day-coverage provider [email or web address]  04/19/2020, 3:33 AM

## 2020-04-18 NOTE — ED Triage Notes (Addendum)
Pt states she went to bed ~1230am-woke on her floor ~7am with window blinds broken-does not know how long she was on the floor-denies not feeling well and denies pain-pt answers "I don't know-I'm just tired"" when asked ?s about event-pt slow to answer orientation ?s but answers correctly-NAD-steady gait

## 2020-04-18 NOTE — Consult Note (Addendum)
NEUROLOGY CONSULTATION NOTE   Date of service: April 18, 2020 Patient Name: Karen Mcdowell MRN:  [id number] DOB:  1966/03/14 Reason for consult: " Seizure"  History of Present Illness  Karen Mcdowell is a 54 y.o. female with PMH significant for irritable bowel syndrome and cholecystectomy who presents with possible first-time seizure.  Patient woke up on the floor in her bedroom with her knee screaming that she had a seizure. No loss of bowel, bladder, no tongue biting. No history of alcohol use, no prior CNS surgery, no prior CNS infection, no family history of seizures. No prior personal hx of seizure, no hx of episodes of starring off.  She was being discharged from the ED when she had a 3 minute episode of all extremity shaking concerning for a seizure.  She was given 2 mg of Ativan IV and noted to have a small abrasion on her right lower lip.  She was loaded with Keppra 2 g and started on maintenance Keppra 500 mg twice daily.  She was transferred to [facility] for further work-up and treatment.  Vital signs with elevated blood pressure to 193 systolic at presentation, CBC with leukocytosis with white cell count of 13.1, no significant electrolyte abnormality, UA not concerning for UTI, urine drug screen positive for tetrahydrocannabinol.  Covid swab and influenza testing was negative.  Work-up with CT head with no acute abnormalities.   ROS   Constitutional Denies weight loss, fever and chills.  HEENT Denies changes in vision and hearing.  Respiratory Denies SOB and cough.  CV Denies palpitations and CP   GI Denies abdominal pain, nausea, vomiting and diarrhea.   GU Denies dysuria and urinary frequency.   MSK Denies myalgia and joint pain.   Skin Denies rash and pruritus.   Neurological Denies headache and syncope.   Psychiatric Denies recent changes in mood. Denies anxiety and depression.    Past History   Past Medical History:  Diagnosis Date  . IBS (irritable bowel  syndrome)    Past Surgical History:  Procedure Laterality Date  . CHOLECYSTECTOMY     No family history on file. Social History   Socioeconomic History  . Marital status: Single    Spouse name: Not on file  . Number of children: Not on file  . Years of education: Not on file  . Highest education level: Not on file  Occupational History  . Not on file  Tobacco Use  . Smoking status: Current Every Day Smoker    Types: Cigars  . Smokeless tobacco: Never Used  Vaping Use  . Vaping Use: Never used  Substance and Sexual Activity  . Alcohol use: No  . Drug use: Yes    Types: Marijuana    Comment: daily  . Sexual activity: Not on file  Other Topics Concern  . Not on file  Social History Narrative  . Not on file   Social Determinants of Health   Financial Resource Strain:   . Difficulty of Paying Living Expenses: Not on file  Food Insecurity:   . Worried About Programme researcher, broadcasting/film/video in the Last Year: Not on file  . Ran Out of Food in the Last Year: Not on file  Transportation Needs:   . Lack of Transportation (Medical): Not on file  . Lack of Transportation (Non-Medical): Not on file  Physical Activity:   . Days of Exercise per Week: Not on file  . Minutes of Exercise per Session: Not on file  Stress:   .  Feeling of Stress : Not on file  Social Connections:   . Frequency of Communication with Friends and Family: Not on file  . Frequency of Social Gatherings with Friends and Family: Not on file  . Attends Religious Services: Not on file  . Active Member of Clubs or Organizations: Not on file  . Attends Banker Meetings: Not on file  . Marital Status: Not on file   No Known Allergies  Medications   Medications Prior to Admission  Medication Sig Dispense Refill Last Dose  . amoxicillin-clavulanate (AUGMENTIN) 875-125 MG tablet Take 1 tablet by mouth every 12 (twelve) hours. 14 tablet 0   . fluticasone (FLONASE) 50 MCG/ACT nasal spray Place 1 spray into  both nostrils daily. 16 g 0   . HYDROcodone-acetaminophen (NORCO/VICODIN) 5-325 MG per tablet Take 1 tablet by mouth every 6 (six) hours as needed for moderate pain. 12 tablet 0   . ibuprofen (ADVIL,MOTRIN) 800 MG tablet Take 1 tablet (800 mg total) by mouth once. 30 tablet 0      Vitals   Vitals:   04/18/20 1900 04/18/20 2000 04/18/20 2028 04/18/20 2120  BP: 128/81 106/74 104/74 106/66  Pulse: 84 89 87 77  Resp: 20 18 18 18   Temp:    99 F (37.2 C)  TempSrc:    Oral  SpO2: 96% 97% 98% 100%  Weight:      Height:         Body mass index is 26.57 kg/m.  Physical Exam   General: Laying comfortably in bed; in no acute distress.  HENT: Normal oropharynx and mucosa. Normal external appearance of ears and nose.  Neck: Supple, no pain or tenderness  CV: No JVD. No peripheral edema.  Pulmonary: Symmetric Chest rise. Normal respiratory effort.  Abdomen: Soft to touch, non-tender.  Ext: No cyanosis, edema, or deformity  Skin: No rash. Normal palpation of skin.   Musculoskeletal: Normal digits and nails by inspection. No clubbing.   Neurologic Examination  Mental status/Cognition: Alert, oriented to self, place, month and year, good attention.  Speech/language: Fluent, comprehension intact, object naming intact, repetition intact.  Cranial nerves:   CN II Pupils equal and reactive to light, no VF deficits    CN III,IV,VI EOM intact, no gaze preference or deviation, no nystagmus    CN V normal sensation in V1, V2, and V3 segments bilaterally    CN VII no asymmetry, no nasolabial fold flattening    CN VIII normal hearing to speech    CN IX & X normal palatal elevation, no uvular deviation    CN XI 5/5 head turn and 5/5 shoulder shrug bilaterally    CN XII midline tongue protrusion    Motor:  Muscle bulk: normal, tone normal, pronator drift normal tremor none Mvmt Root Nerve  Muscle Right Left Comments  SA C5/6 Ax Deltoid 5 5   EF C5/6 Mc Biceps 5 5   EE C6/7/8 Rad Triceps 5 5    WF C6/7 Med FCR 5 5   WE C7/8 PIN ECU 5 5   F Ab C8/T1 U ADM/FDI 5 5   HF L1/2/3 Fem Illopsoas 5 5   KE L2/3/4 Fem Quad 5 5   DF L4/5 D Peron Tib Ant 5 5   PF S1/2 Tibial Grc/Sol  5    Reflexes:  Right Left Comments  Pectoralis      Biceps (C5/6) 1 1   Brachioradialis (C5/6) 1 1    Triceps (C6/7) 1  1    Patellar (L3/4) 1 1    Achilles (S1) 1 1    Hoffman      Plantar     Jaw jerk    Sensation:  Light touch Intact throughout   Pin prick    Temperature    Vibration   Proprioception    Coordination/Complex Motor:  - Finger to Nose intact BL - Heel to shin intact BL - Rapid alternating movement are normal - Gait: deferred  Labs   CBC:  Recent Labs  Lab 04/18/20 1135 04/18/20 1244  WBC 13.1*  --   NEUTROABS 8.6*  --   HGB 13.6 13.3  HCT 40.6 39.0  MCV 93.1  --   PLT 321  --     Basic Metabolic Panel:  Lab Results  Component Value Date   NA 138 04/18/2020   K 4.2 04/18/2020   CO2 27 04/18/2020   GLUCOSE 108 (H) 04/18/2020   BUN 12 04/18/2020   CREATININE 1.00 04/18/2020   CALCIUM 9.3 04/18/2020   GFRNONAA >60 04/18/2020   Lipid Panel: No results found for: LDLCALC HgbA1c: No results found for: HGBA1C Urine Drug Screen:     Component Value Date/Time   LABOPIA NONE DETECTED 04/18/2020 1334   COCAINSCRNUR NONE DETECTED 04/18/2020 1334   LABBENZ NONE DETECTED 04/18/2020 1334   AMPHETMU NONE DETECTED 04/18/2020 1334   THCU POSITIVE (A) 04/18/2020 1334   LABBARB NONE DETECTED 04/18/2020 1334    Alcohol Level     Component Value Date/Time   ETH <10 04/18/2020 1135     Results for orders placed during the hospital encounter of 04/18/20  CT Head Wo Contrast  Narrative CLINICAL DATA:  Nontraumatic seizure  EXAM: CT HEAD WITHOUT CONTRAST  TECHNIQUE: Contiguous axial images were obtained from the base of the skull through the vertex without intravenous contrast. Sagittal and coronal MPR images reconstructed from axial data  set.  COMPARISON:  None  FINDINGS: Brain: Normal ventricular morphology. No midline shift or mass effect. Normal appearance of brain parenchyma. No intracranial hemorrhage, mass lesion or evidence of acute infarction. No extra-axial fluid collections.  Vascular: Unremarkable.  No hyperdense vessels.  Skull: Intact  Sinuses/Orbits: Clear  Other: N/A  IMPRESSION: Normal exam.   Electronically Signed By: Ulyses Southward M.D. On: 04/18/2020 13:27    Impression   Karen Mcdowell is a 54 y.o. female with PMH significant for irritable bowel syndrome and cholecystectomy who presents with possible first-time seizure. No clear provoking factors. Her neurologic examination with no focal deficit, intact cognition. Given 2 seizures within a few hours, reasonable to monitor in the hospita overnight. We will get a first time seizure work with MRI and a rEEG.  Recommendations  - rEEG - MRI Brain without contrast with thin slices through the temporal lobe - Discussed with her that she should stop using marijuana - No driving until free of any seizures for 6 months and has to be cleared by neurology before she can resume driving. - Seizure pads and seizure precautions - full discharge precautions listed below. - PRN ativan 2mg  for seizure lasting more than 3 mins ______________________________________________________________________  Seizure precautions: Per [facility] statutes, patients with seizures are not allowed to drive until they have been seizure-free for six months and cleared by a physician    Use caution when using heavy equipment or power tools. Avoid working on ladders or at heights. Take showers instead of baths. Ensure the water temperature is not too high on  the home water heater. Do not go swimming alone. Do not lock yourself in a room alone (i.e. bathroom). When caring for infants or small children, sit down when holding, feeding, or changing them to minimize risk of  injury to the child in the event you have a seizure. Maintain good sleep hygiene. Avoid alcohol.    If patient has another seizure, call 911 and bring them back to the ED if: A.  The seizure lasts longer than 5 minutes.      B.  The patient doesn't wake shortly after the seizure or has new problems such as difficulty seeing, speaking or moving following the seizure C.  The patient was injured during the seizure D.  The patient has a temperature over 102 F (39C) E.  The patient vomited during the seizure and now is having trouble breathing    During the Seizure   - First, ensure adequate ventilation and place patients on the floor on their left side  Loosen clothing around the neck and ensure the airway is patent. If the patient is clenching the teeth, do not force the mouth open with any object as this can cause severe damage - Remove all items from the surrounding that can be hazardous. The patient may be oblivious to what's happening and may not even know what he or she is doing. If the patient is confused and wandering, either gently guide him/her away and block access to outside areas - Reassure the individual and be comforting - Call 911. In most cases, the seizure ends before EMS arrives. However, there are cases when seizures may last over 3 to 5 minutes. Or the individual may have developed breathing difficulties or severe injuries. If a pregnant patient or a person with diabetes develops a seizure, it is prudent to call an ambulance. - Finally, if the patient does not regain full consciousness, then call EMS. Most patients will remain confused for about 45 to 90 minutes after a seizure, so you must use judgment in calling for help. - Avoid restraints but make sure the patient is in a bed with padded side rails - Place the individual in a lateral position with the neck slightly flexed; this will help the saliva drain from the mouth and prevent the tongue from falling backward - Remove all  nearby furniture and other hazards from the area - Provide verbal assurance as the individual is regaining consciousness - Provide the patient with privacy if possible - Call for help and start treatment as ordered by the caregiver    After the Seizure (Postictal Stage)   After a seizure, most patients experience confusion, fatigue, muscle pain and/or a headache. Thus, one should permit the individual to sleep. For the next few days, reassurance is essential. Being calm and helping reorient the person is also of importance.   Most seizures are painless and end spontaneously. Seizures are not harmful to others but can lead to complications such as stress on the lungs, brain and the heart. Individuals with prior lung problems may develop labored breathing and respiratory distress.     Thank you for the opportunity to take part in the care of this patient. If you have any further questions, please contact the neurology consultation attending.  Signed,  Erick BlinksSalman Consuelo Suthers [facility] Pager Number [phone]

## 2020-04-19 ENCOUNTER — Observation Stay (HOSPITAL_COMMUNITY): Payer: Self-pay

## 2020-04-19 DIAGNOSIS — D72829 Elevated white blood cell count, unspecified: Secondary | ICD-10-CM | POA: Diagnosis present

## 2020-04-19 DIAGNOSIS — R569 Unspecified convulsions: Principal | ICD-10-CM

## 2020-04-19 LAB — BASIC METABOLIC PANEL
Anion gap: 8 (ref 5–15)
BUN: 10 mg/dL (ref 6–20)
CO2: 24 mmol/L (ref 22–32)
Calcium: 9.1 mg/dL (ref 8.9–10.3)
Chloride: 107 mmol/L (ref 98–111)
Creatinine, Ser: 0.87 mg/dL (ref 0.44–1.00)
GFR, Estimated: >60 mL/min (ref >60)
Glucose, Bld: 79 mg/dL (ref 70–99)
Potassium: 3.4 mmol/L — ABNORMAL LOW (ref 3.5–5.1)
Sodium: 139 mmol/L (ref 135–145)

## 2020-04-19 LAB — CBC
HCT: 38.9 % (ref 36.0–46.0)
Hemoglobin: 13.1 g/dL (ref 12.0–15.0)
MCH: 31 pg (ref 26.0–34.0)
MCHC: 33.7 g/dL (ref 30.0–36.0)
MCV: 92 fL (ref 80.0–100.0)
Platelets: 318 10*3/uL (ref 150–400)
RBC: 4.23 MIL/uL (ref 3.87–5.11)
RDW: 12.5 % (ref 11.5–15.5)
WBC: 13.7 10*3/uL — ABNORMAL HIGH (ref 4.0–10.5)
nRBC: 0 % (ref 0.0–0.2)

## 2020-04-19 LAB — TSH: TSH: 0.567 u[IU]/mL (ref 0.350–4.500)

## 2020-04-19 LAB — HIV ANTIBODY (ROUTINE TESTING W REFLEX): HIV Screen 4th Generation wRfx: NONREACTIVE

## 2020-04-19 MED ORDER — LEVETIRACETAM 500 MG PO TABS: 500.0000 mg | ORAL_TABLET | Freq: Two times a day (BID) | ORAL | 0 refills | Status: AC

## 2020-04-19 MED ORDER — LABETALOL HCL 5 MG/ML IV SOLN: 5.0000 mg | INTRAVENOUS | Status: DC | PRN

## 2020-04-19 NOTE — Progress Notes (Signed)
Nsg Discharge Note  Admit Date:  04/18/2020 Discharge date: 04/19/2020   Karen Mcdowell to be D/C'd home per MD order.  AVS completed.  Copy for chart, and copy for patient signed, and dated. Patient/caregiver able to verbalize understanding.  Discharge Medication: Allergies as of 04/19/2020   No Known Allergies     Medication List    STOP taking these medications   amoxicillin-clavulanate 875-125 MG tablet Commonly known as: AUGMENTIN   fluticasone 50 MCG/ACT nasal spray Commonly known as: FLONASE   HYDROcodone-acetaminophen 5-325 MG tablet Commonly known as: NORCO/VICODIN   ibuprofen 800 MG tablet Commonly known as: ADVIL     TAKE these medications   levETIRAcetam 500 MG tablet Commonly known as: KEPPRA Take 1 tablet (500 mg total) by mouth 2 (two) times daily.       Discharge Assessment: Vitals:   04/19/20 0326 04/19/20 1150  BP: 114/76 (!) 116/99  Pulse: 84 72  Resp: 17 18  Temp: 99.3 F (37.4 C) 98.5 F (36.9 C)  SpO2: 99% 98%   Skin clean, dry and intact without evidence of skin break down, no evidence of skin tears noted. IV catheter discontinued intact. Site without signs and symptoms of complications - no redness or edema noted at insertion site, patient denies c/o pain - only slight tenderness at site.  Dressing with slight pressure applied.  D/c Instructions-Education: Discharge instructions given to patient/family with verbalized understanding. D/c education completed with patient/family including follow up instructions, medication list, d/c activities limitations if indicated, with other d/c instructions as indicated by MD - patient able to verbalize understanding, all questions fully answered. Patient instructed to return to ED, call 911, or call MD for any changes in condition.  Patient escorted via WC, and D/C home via private auto.  Kizzie Bane, RN 04/19/2020 2:57 PM

## 2020-04-19 NOTE — Procedures (Addendum)
Patient Name: [name]  MRN: [id number]  Epilepsy Attending: Charlsie Quest  Referring Physician/Provider: Dr Londell Moh Date: 04/19/2020 Duration: 25.50 mins  Patient history: 54 y.o. female with PMH significant for irritable bowel syndrome and cholecystectomy who presents with possible first-time seizure. EEG to evaluate for seizure  Level of alertness: Awake, asleep  AEDs during EEG study: LEV  Technical aspects: This EEG study was done with scalp electrodes positioned according to the 10-20 International system of electrode placement. Electrical activity was acquired at a sampling rate of 500Hz  and reviewed with a high frequency filter of 70Hz  and a low frequency filter of 1Hz . EEG data were recorded continuously and digitally stored.   Description: The posterior dominant rhythm consists of 8-9 Hz activity of moderate voltage (25-35 uV) seen predominantly in posterior head regions, symmetric and reactive to eye opening and eye closing. Sleep was characterized by vertex waves, sleep spindles (12 to 14 Hz), maximal frontocentral region.  Hyperventilation did not show any EEG change.  Physiologic photic driving was not seen during photic stimulation.    IMPRESSION: This study is within normal limits. No seizures or epileptiform discharges were seen throughout the recording.  Benito Lemmerman 

## 2020-04-19 NOTE — Progress Notes (Addendum)
PROGRESS NOTE   Karen Mcdowell  JAS:[id number] DOB: 01-18-1966 DOA: 04/18/2020 PCP: Lynnea Ferrier., MD   Brief Narrative:  Karen Mcdowell is a 54 y.o. female with medical history of IBS, cholecystectomy,, acute nonrecurrent maxillary sinusitis, history of acute right knee pain andwith referral to orthopedic surgeon in 2019 presents to med [facility] for chief concern of possible seizure. Per medicine ED note and neurology note patient was found on the floor with her niece screaming that the patient had a seizure.  No loss of bowel, bladder, tongue biting.  No history of alcohol use, no prior CNS surgery, no prior CNS infection, no personal history of seizure, and no family history of seizure. She was in the process of being discharged from the emergency department admit center when she had 3-minute episode of all extremity shaking concerning for seizure.  She was given 2 mg of Ativan IV and noted to have a small abrasion on her right lower lip.  She was loaded with Keppra 2 g and started on maintenance Keppra 500 mg twice daily.  In the emergency department at med center, ED provider called called neurology where she was recommended to be transferred to [facility] for further work-up, EEG, and MRI of the brain. At bedside Ms. Karen Mcdowell appears lethargic.  She is arousable verbally and awake and oriented to self, age, current location of [facility], and current year.  Review of system was negative for headache, vision changes, dysphagia, odynophagia, nausea, vomiting, chest pain, shortness of breath, abdominal pain, dysuria, hematuria, diarrhea, constipation, suicidal ideation, suicidal ideation, appetite loss, fever, chills, cough.  She endorsed feeling tired and sleepy.  She denies ever having seizures in the past and denies comments from family of patient staring off into space.  Patient denies family history of seizure. She was given 1 dose of Ativan 2 mg IV at approximately  1422 on 04/18/2020, loading Keppra totaling 2 g IV, and started normal saline infusion at 75 cc/h.   Assessment & Plan:   Principal Problem:   Seizures (HCC) Active Problems:   Seizure (HCC)   Leukocytosis   Seizure-first episode, etiology unknown at this time - Neurology following, appreciate insight and recommendations -Unclear etiology, urine drug screen is positive for THC she denies other illicit substance uses, benzo use or alcohol use or subsequent cessation. - EEG unremarkable per neurology for epileptiform activity - MRI unremarkable - No driving until free of any seizure for 6 months and has to be cleared by neurology before she can resume driving - Currently on Keppra 500 mg twice daily, defer to neurology for medication changes - Continue Ativan 2 mg as needed for seizures - Seizure precautions ongoing Lab Results  Component Value Date   TSH 0.567 04/19/2020   Transient hypertension suspected secondary to seizure activity - Currently normotensive and per patient, no personal history of hypertension - Labetalol prn   Leukemoid reaction, - UA negative, follow for signs or symptoms of acute infection  DVT prophylaxis: enoxaparin Code Status: FULL Family Communication: None present  Status is: Inpatient  Dispo: The patient is from: Home              Anticipated d/c is to: To be determined              Anticipated d/c date is: 24 to 48 hours pending clinical course              Patient currently not medically stable for discharge  given ongoing need for further evaluation, imaging with neurology for new onset seizures  Consultants:   Neurology  Procedures:   None planned  Antimicrobials:  None indicated  Subjective: No acute issues or events overnight, currently undergoing EEG, denies nausea, vomiting, diarrhea, constipation, headache, fevers, chills.  Objective: Vitals:   04/18/20 2028 04/18/20 2120 04/18/20 2346 04/19/20 0326  BP: 104/74 106/66  (!) 135/43 114/76  Pulse: 87 77 (!) 102 84  Resp: 18 18 18 17   Temp:  99 F (37.2 C) 100.2 F (37.9 C) 99.3 F (37.4 C)  TempSrc:  Oral Oral Oral  SpO2: 98% 100% 99% 99%  Weight:      Height:       No intake or output data in the 24 hours ending 04/19/20 0725 Filed Weights   04/18/20 1127  Weight: 68 kg    Examination:  General exam: Appears calm and comfortable  Respiratory system: Clear to auscultation. Respiratory effort normal. Cardiovascular system: S1 & S2 heard, RRR. No JVD, murmurs, rubs, gallops or clicks. No pedal edema. Gastrointestinal system: Abdomen is nondistended, soft and nontender. No organomegaly or masses felt. Normal bowel sounds heard. Central nervous system: Alert and oriented. No focal neurological deficits. Extremities: Symmetric 5 x 5 power. Skin: No rashes, lesions or ulcers.  Data Reviewed: I have personally reviewed following labs and imaging studies  CBC: Recent Labs  Lab 04/18/20 1135 04/18/20 1244 04/19/20 0203  WBC 13.1*  --  13.7*  NEUTROABS 8.6*  --   --   HGB 13.6 13.3 13.1  HCT 40.6 39.0 38.9  MCV 93.1  --  92.0  PLT 321  --  318   Basic Metabolic Panel: Recent Labs  Lab 04/18/20 1135 04/18/20 1244 04/19/20 0254  NA 138 138 139  K 4.0 4.2 3.4*  CL 102 102 107  CO2 27  --  24  GLUCOSE 104* 108* 79  BUN 13 12 10   CREATININE 0.83 1.00 0.87  CALCIUM 9.3  --  9.1  MG 2.4  --   --    GFR: Estimated Creatinine Clearance: 70 mL/min (by C-G formula based on SCr of 0.87 mg/dL). Liver Function Tests: Recent Labs  Lab 04/18/20 1135  AST 26  ALT 21  ALKPHOS 67  BILITOT 0.2*  PROT 7.1  ALBUMIN 4.2   No results for input(s): LIPASE, AMYLASE in the last 168 hours. No results for input(s): AMMONIA in the last 168 hours. Coagulation Profile: No results for input(s): INR, PROTIME in the last 168 hours. Cardiac Enzymes: No results for input(s): CKTOTAL, CKMB, CKMBINDEX, TROPONINI in the last 168 hours. BNP (last 3  results) No results for input(s): PROBNP in the last 8760 hours. HbA1C: No results for input(s): HGBA1C in the last 72 hours. CBG: Recent Labs  Lab 04/18/20 1418  GLUCAP 137*   Lipid Profile: No results for input(s): CHOL, HDL, LDLCALC, TRIG, CHOLHDL, LDLDIRECT in the last 72 hours. Thyroid Function Tests: Recent Labs    04/19/20 0254  TSH 0.567   Anemia Panel: No results for input(s): VITAMINB12, FOLATE, FERRITIN, TIBC, IRON, RETICCTPCT in the last 72 hours. Sepsis Labs: No results for input(s): PROCALCITON, LATICACIDVEN in the last 168 hours.  Recent Results (from the past 240 hour(s))  Respiratory Panel by RT PCR (Flu A&B, Covid) - Nasopharyngeal Swab     Status: None   Collection Time: 04/18/20  4:12 PM   Specimen: Nasopharyngeal Swab  Result Value Ref Range Status   SARS Coronavirus 2 by RT  PCR NEGATIVE NEGATIVE Final    Comment: (NOTE) SARS-CoV-2 target nucleic acids are NOT DETECTED.  The SARS-CoV-2 RNA is generally detectable in upper respiratoy specimens during the acute phase of infection. The lowest concentration of SARS-CoV-2 viral copies this assay can detect is 131 copies/mL. A negative result does not preclude SARS-Cov-2 infection and should not be used as the sole basis for treatment or other patient management decisions. A negative result may occur with  improper specimen collection/handling, submission of specimen other than nasopharyngeal swab, presence of viral mutation(s) within the areas targeted by this assay, and inadequate number of viral copies (<131 copies/mL). A negative result must be combined with clinical observations, patient history, and epidemiological information. The expected result is Negative.  Fact Sheet for Patients:  https://www.moore.com/  Fact Sheet for Healthcare Providers:  https://www.young.biz/  This test is no t yet approved or cleared by the [place] FDA and  has been  authorized for detection and/or diagnosis of SARS-CoV-2 by FDA under an Emergency Use Authorization (EUA). This EUA will remain  in effect (meaning this test can be used) for the duration of the COVID-19 declaration under Section 564(b)(1) of the Act, 21 U.S.C. section 360bbb-3(b)(1), unless the authorization is terminated or revoked sooner.     Influenza A by PCR NEGATIVE NEGATIVE Final   Influenza B by PCR NEGATIVE NEGATIVE Final    Comment: (NOTE) The Xpert Xpress SARS-CoV-2/FLU/RSV assay is intended as an aid in  the diagnosis of influenza from Nasopharyngeal swab specimens and  should not be used as a sole basis for treatment. Nasal washings and  aspirates are unacceptable for Xpert Xpress SARS-CoV-2/FLU/RSV  testing.  Fact Sheet for Patients: https://www.moore.com/  Fact Sheet for Healthcare Providers: https://www.young.biz/  This test is not yet approved or cleared by the [place] FDA and  has been authorized for detection and/or diagnosis of SARS-CoV-2 by  FDA under an Emergency Use Authorization (EUA). This EUA will remain  in effect (meaning this test can be used) for the duration of the  Covid-19 declaration under Section 564(b)(1) of the Act, 21  U.S.C. section 360bbb-3(b)(1), unless the authorization is  terminated or revoked. Performed at [facility], [address] [place], [place] [address]          Radiology Studies: CT Head Wo Contrast  Result Date: 04/18/2020 CLINICAL DATA:  Nontraumatic seizure EXAM: CT HEAD WITHOUT CONTRAST TECHNIQUE: Contiguous axial images were obtained from the base of the skull through the vertex without intravenous contrast. Sagittal and coronal MPR images reconstructed from axial data set. COMPARISON:  None FINDINGS: Brain: Normal ventricular morphology. No midline shift or mass effect. Normal appearance of brain parenchyma. No intracranial hemorrhage, mass lesion or evidence  of acute infarction. No extra-axial fluid collections. Vascular: Unremarkable.  No hyperdense vessels. Skull: Intact Sinuses/Orbits: Clear Other: N/A IMPRESSION: Normal exam. Electronically Signed   By: Ulyses Southward M.D.   On: 04/18/2020 13:27   Scheduled Meds: . enoxaparin (LOVENOX) injection  40 mg Subcutaneous Q24H  . fluticasone  1 spray Each Nare Daily  . levETIRAcetam  500 mg Oral BID   Continuous Infusions: . sodium chloride 75 mL/hr (04/18/20 2323)     LOS: 1 day   Time spent:  Azucena Fallen, DO [facility]  If 7PM-7AM, please contact night-coverage [email or web address]  04/19/2020, 7:25 AM

## 2020-04-19 NOTE — Consult Note (Addendum)
Neurology Progress Note   Date of service: April 19, 2020 Patient Name: Karen Mcdowell MRN:  [id number] DOB:  11/27/1965 Reason for consult: " Seizure"  Subjective  Seen and examined No complaints or events overnight Getting EEG done  Medications   Medications Prior to Admission  Medication Sig Dispense Refill Last Dose  . amoxicillin-clavulanate (AUGMENTIN) 875-125 MG tablet Take 1 tablet by mouth every 12 (twelve) hours. 14 tablet 0   . fluticasone (FLONASE) 50 MCG/ACT nasal spray Place 1 spray into both nostrils daily. 16 g 0   . HYDROcodone-acetaminophen (NORCO/VICODIN) 5-325 MG per tablet Take 1 tablet by mouth every 6 (six) hours as needed for moderate pain. 12 tablet 0   . ibuprofen (ADVIL,MOTRIN) 800 MG tablet Take 1 tablet (800 mg total) by mouth once. 30 tablet 0      Current Facility-Administered Medications:  .  0.9 %  sodium chloride infusion, 75 mL/hr, Intravenous, Continuous, Cox, Amy N, DO, Last Rate: 75 mL/hr at 04/18/20 2323, 75 mL/hr at 04/18/20 2323 .  enoxaparin (LOVENOX) injection 40 mg, 40 mg, Subcutaneous, Q24H, Cox, Amy N, DO .  fluticasone (FLONASE) 50 MCG/ACT nasal spray 1 spray, 1 spray, Each Nare, Daily, Cox, Amy N, DO .  labetalol (NORMODYNE) injection 5 mg, 5 mg, Intravenous, Q2H PRN, Cox, Amy N, DO .  levETIRAcetam (KEPPRA) tablet 500 mg, 500 mg, Oral, BID, Cox, Amy N, DO .  LORazepam (ATIVAN) injection 1-2 mg, 1-2 mg, Intravenous, Q2H PRN, Cox, Amy N, DO   Vitals   Vitals:   04/18/20 2028 04/18/20 2120 04/18/20 2346 04/19/20 0326  BP: 104/74 106/66 (!) 135/43 114/76  Pulse: 87 77 (!) 102 84  Resp: 18 18 18 17   Temp:  99 F (37.2 C) 100.2 F (37.9 C) 99.3 F (37.4 C)  TempSrc:  Oral Oral Oral  SpO2: 98% 100% 99% 99%  Weight:      Height:         Body mass index is 26.57 kg/m.  Physical Exam  General: Awake alert in no distress HEENT: Normocephalic, atraumatic CVs: Regular rate rhythm Pulmonary: Chest clear to auscultation Abdomen  soft nondistended nontender Neurological exam Awake alert oriented x3 No dysarthria No aphasia Cranial nerves II through XII intact Motor exam: Symmetric 5/5 antigravity in all fours with normal tone. Normal range of motion Sensory exam: Intact to light touch Coordination: No dysmetria Gait testing deferred at this time  Labs   CBC:  Recent Labs  Lab 04/18/20 1135 04/18/20 1135 04/18/20 1244 04/19/20 0203  WBC 13.1*  --   --  13.7*  NEUTROABS 8.6*  --   --   --   HGB 13.6   < > 13.3 13.1  HCT 40.6   < > 39.0 38.9  MCV 93.1  --   --  92.0  PLT 321  --   --  318   < > = values in this interval not displayed.    Basic Metabolic Panel:  Lab Results  Component Value Date   NA 139 04/19/2020   K 3.4 (L) 04/19/2020   CO2 24 04/19/2020   GLUCOSE 79 04/19/2020   BUN 10 04/19/2020   CREATININE 0.87 04/19/2020   CALCIUM 9.1 04/19/2020   GFRNONAA >60 04/19/2020  Urine Drug Screen:     Component Value Date/Time   LABOPIA NONE DETECTED 04/18/2020 1334   COCAINSCRNUR NONE DETECTED 04/18/2020 1334   LABBENZ NONE DETECTED 04/18/2020 1334   AMPHETMU NONE DETECTED 04/18/2020 1334   THCU  POSITIVE (A) 04/18/2020 1334   LABBARB NONE DETECTED 04/18/2020 1334    Alcohol Level     Component Value Date/Time   ETH <10 04/18/2020 1135     Results for orders placed during the hospital encounter of 04/18/20  CT Head Wo Contrast  Narrative CLINICAL DATA:  Nontraumatic seizure  EXAM: CT HEAD WITHOUT CONTRAST  TECHNIQUE: Contiguous axial images were obtained from the base of the skull through the vertex without intravenous contrast. Sagittal and coronal MPR images reconstructed from axial data set.  COMPARISON:  None  FINDINGS: Brain: Normal ventricular morphology. No midline shift or mass effect. Normal appearance of brain parenchyma. No intracranial hemorrhage, mass lesion or evidence of acute infarction. No extra-axial fluid collections.  Vascular: Unremarkable.  No  hyperdense vessels.  Skull: Intact  Sinuses/Orbits: Clear  Other: N/A  IMPRESSION: Normal exam.   Electronically Signed By: Ulyses Southward M.D. On: 04/18/2020 13:27    Impression  54 year old woman past medical history of irritable bowel syndrome, cholecystectomy, THC use presenting with possible first-time seizure. No clear provoking factors except for THC use. No focal neurological deficits noted at this time. Given the multiplicity of the seizure episodes separated by many hours and prolonged postictal state, I am inclined to start antiepileptic medication at this time. Needs to maintain seizure precautions as below.  Recommendations  -Continue Keppra 500 twice daily. -Discussed in detail the importance of refraining from [facility] abuse. -Awaiting EEG and MRI at this time-we will follow -Discussed in detail the [facility] state law that prohibits driving the last 6 months seizure-free, which she verbalized understanding of. ______________________________________________________________________  Seizure precautions: Per [facility] statutes, patients with seizures are not allowed to drive until they have been seizure-free for six months and cleared by a physician    Use caution when using heavy equipment or power tools. Avoid working on ladders or at heights. Take showers instead of baths. Ensure the water temperature is not too high on the home water heater. Do not go swimming alone. Do not lock yourself in a room alone (i.e. bathroom). When caring for infants or small children, sit down when holding, feeding, or changing them to minimize risk of injury to the child in the event you have a seizure. Maintain good sleep hygiene. Avoid alcohol.    If patient has another seizure, call 911 and bring them back to the ED if: A.  The seizure lasts longer than 5 minutes.      B.  The patient doesn't wake shortly after the seizure or has new problems such as difficulty seeing,  speaking or moving following the seizure C.  The patient was injured during the seizure D.  The patient has a temperature over 102 F (39C) E.  The patient vomited during the seizure and now is having trouble breathing      ADDENDUM EEG normal MRI normal. Seizure likely provoked from marijuana use - counsel on cessation. Would be reluctant to not use AEDs due to multiple events and prolonged post ictal state.  Plan relayed to Dr. Natale Milch via secure chat     -- Milon Dikes, MD [facility] Pager: [phone] If 7pm to 7am, please call on call as listed on AMION.

## 2020-04-19 NOTE — Plan of Care (Signed)

## 2020-04-19 NOTE — Discharge Instructions (Signed)
Recommendations for Outpatient Follow-up:  1. Follow up with PCP in 1-2 weeks 2. Neurology as scheduled results:   Seizure, Adult A seizure is a sudden burst of abnormal electrical activity in the brain. Seizures usually last from 30 seconds to 2 minutes. They can cause many different symptoms. Usually, seizures are not harmful unless they last a long time. What are the causes? Common causes of this condition include:  Fever or infection.  Conditions that affect the brain, such as: ? A brain abnormality that you were born with. ? A brain or head injury. ? Bleeding in the brain. ? A tumor. ? Stroke. ? Brain disorders such as autism or cerebral palsy.  Low blood sugar.  Conditions that are passed from parent to child (are inherited).  Problems with substances, such as: ? Having a reaction to a drug or a medicine. ? Suddenly stopping the use of a substance (withdrawal). In some cases, the cause may not be known. A person who has repeated seizures over time without a clear cause has a condition called epilepsy. What increases the risk? You are more likely to get this condition if you have:  A family history of epilepsy.  Had a seizure in the past.  A brain disorder.  A history of head injury, lack of oxygen at birth, or strokes. What are the signs or symptoms? There are many types of seizures. The symptoms vary depending on the type of seizure you have. Examples of symptoms during a seizure include:  Shaking (convulsions).  Stiffness in the body.  Passing out (losing consciousness).  Head nodding.  Staring.  Not responding to sound or touch.  Loss of bladder control and bowel control. Some people have symptoms right before and right after a seizure happens. Symptoms before a seizure may include:  Fear.  Worry (anxiety).  Feeling like you may vomit (nauseous).  Feeling like the room is spinning (vertigo).  Feeling like you saw or heard something before  (dj vu).  Odd tastes or smells.  Changes in how you see. You may see flashing lights or spots. Symptoms after a seizure happens can include:  Confusion.  Sleepiness.  Headache.  Weakness on one side of the body. How is this treated? Most seizures will stop on their own in under 5 minutes. In these cases, no treatment is needed. Seizures that last longer than 5 minutes will usually need treatment. Treatment can include:  Medicines given through an IV tube.  Avoiding things that are known to cause your seizures. These can include medicines that you take for another condition.  Medicines to treat epilepsy.  Surgery to stop the seizures. This may be needed if medicines do not help. Follow these instructions at home: Medicines  Take over-the-counter and prescription medicines only as told by your doctor.  Do not eat or drink anything that may keep your medicine from working, such as alcohol. Activity  Do not do any activities that would be dangerous if you had another seizure, like driving or swimming. Wait until your doctor says it is safe for you to do them.  If you live in the [place] ask your local DMV (department of motor vehicles) when you can drive.  Get plenty of rest. Teaching others Teach friends and family what to do when you have a seizure. They should:  Lay you on the ground.  Protect your head and body.  Loosen any tight clothing around your neck.  Turn you on your side.  Not hold  you down.  Not put anything into your mouth.  Know whether or not you need emergency care.  Stay with you until you are better.  General instructions  Contact your doctor each time you have a seizure.  Avoid anything that gives you seizures.  Keep a seizure diary. Write down: ? What you think caused each seizure. ? What you remember about each seizure.  Keep all follow-up visits as told by your doctor. This is important. Contact a doctor if:  You have another  seizure.  You have seizures more often.  There is any change in what happens during your seizures.  You keep having seizures with treatment.  You have symptoms of being sick or having an infection. Get help right away if:  You have a seizure that: ? Lasts longer than 5 minutes. ? Is different than seizures you had before. ? Makes it harder to breathe. ? Happens after you hurt your head.  You have any of these symptoms after a seizure: ? Not being able to speak. ? Not being able to use a part of your body. ? Confusion. ? A bad headache.  You have two or more seizures in a row.  You do not wake up right after a seizure.  You get hurt during a seizure. These symptoms may be an emergency. Do not wait to see if the symptoms will go away. Get medical help right away. Call your local emergency services (911 in the [place] Do not drive yourself to the hospital. Summary  Seizures usually last from 30 seconds to 2 minutes. Usually, they are not harmful unless they last a long time.  Do not eat or drink anything that may keep your medicine from working, such as alcohol.  Teach friends and family what to do when you have a seizure.  Contact your doctor each time you have a seizure. This information is not intended to replace advice given to you by your health care provider. Make sure you discuss any questions you have with your health care provider. Document Revised: 09/01/2018 Document Reviewed: 09/01/2018 Elsevier Patient Education  [place].

## 2020-04-19 NOTE — Care Management (Signed)
Spoke with patient regarding d/c.  She is able to pay the $9 at [facility] for Keppra.  No barriers to d/c.

## 2020-04-19 NOTE — Discharge Summary (Signed)
Physician Discharge Summary  Karen Mcdowell WUJ:[id number] DOB: February 04, 1966 DOA: 04/18/2020  PCP: Lynnea Ferrier'Keeffe, Michael E., MD  Admit date: 04/18/2020 Discharge date: 04/19/2020  Admitted From: Home Disposition: Home  Recommendations for Outpatient Follow-up:  1. Follow up with PCP in 1-2 weeks 2. Neurology as scheduled results:  Home Health: None Equipment/Devices: None  Discharge Condition: Stable CODE STATUS: Full Diet recommendation: Low-fat low-salt diet  Brief/Interim Summary: Karen Mcdowell a 54 y.o.femalewithmedical history of IBS, cholecystectomy,,acute nonrecurrent maxillary sinusitis,history of acute right knee pain andwith referral to orthopedic surgeon in 2019 presents to med [facility] for chief concern of possible seizure. Per medicine ED note and neurology note patient was found on the floor with her niece screaming that the patient had a seizure. No loss of bowel, bladder, tongue biting. No history of alcohol use,no prior CNS surgery,no prior CNS infection,no personal history of seizure, and no family history of seizure. She was in the process of being discharged from the emergency department admit center when she had 3-minute episode of all extremity shaking concerning for seizure. She was given 2 mg of Ativan IV and noted to have a small abrasion on her right lower lip. She was loaded with Keppra 2 g and started on maintenance Keppra 500 mg twice daily. In the emergency department at med center, ED provider called called neurology where she was recommended to be [facility] for further work-up, EEG, and MRI of the brain. At bedside Ms. Geanie Quickappears lethargic. She is arousable verbally andawake and oriented to self, age, [facility], and current year. Review of system was negative for headache, vision changes, dysphagia, odynophagia, nausea, vomiting, chest pain, shortness of breath, abdominal pain,  dysuria, hematuria, diarrhea, constipation, suicidal ideation, suicidal ideation, appetite loss, fever, chills, cough. She endorsed feeling tired and sleepy. She denies ever having seizures in the past and denies comments from family of patient staring off into space. Patient denies family history of seizure. She was given 1 dose of Ativan 2 mg IV at approximately 1422 on 04/18/2020, loading Keppra totaling 2 g IV, and started normal saline infusion at 75 cc/h.  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  EEG, MRI unremarkable, neurology following, recommending no driving for 6 months until cleared by their office.  Patient otherwise stable and agreeable for discharge home.  Discharge Diagnoses:  Principal Problem:   Seizures (HCC) Active Problems:   Seizure (HCC)   Leukocytosis    Discharge Instructions  Discharge Instructions    Diet - low sodium heart healthy   Complete by: As directed    Increase activity slowly   Complete by: As directed      Allergies as of 04/19/2020   No Known Allergies     Medication List    STOP taking these medications   amoxicillin-clavulanate 875-125 MG tablet Commonly known as: AUGMENTIN   fluticasone 50 MCG/ACT nasal spray Commonly known as: FLONASE   HYDROcodone-acetaminophen 5-325 MG tablet Commonly known as: NORCO/VICODIN   ibuprofen 800 MG tablet Commonly known as: ADVIL     TAKE these medications   levETIRAcetam 500 MG tablet Commonly known as: KEPPRA Take 1 tablet (500 mg total) by mouth 2 (two) times daily.       No Known Allergies  Consultations:  Neurology   Procedures/Studies: EEG now  Result Date: 04/19/2020 Charlsie QuestYadav, Priyanka O, MD     04/19/2020 11:30  AM Patient Name: Karen Mcdowell MRN: [id number] Epilepsy Attending: Charlsie Quest Referring Physician/Provider: Dr Londell Moh Date: 04/19/2020 Duration: 25.50 mins Patient history: 54 y.o.  female with PMH significant for irritable bowel syndrome and cholecystectomy who presents with possible first-time seizure. EEG to evaluate for seizure Level of alertness: Awake, asleep AEDs during EEG study: LEV Technical aspects: This EEG study was done with scalp electrodes positioned according to the 10-20 International system of electrode placement. Electrical activity was acquired at a sampling rate of  and reviewed with a high frequency filter of  and a low frequency filter of . EEG data were recorded continuously and digitally stored. Description: The posterior dominant rhythm consists of 8-9 Hz activity of moderate voltage (25-35 uV) seen predominantly in posterior head regions, symmetric and reactive to eye opening and eye closing. Sleep was characterized by vertex waves, sleep spindles (12 to 14 Hz), maximal frontocentral region.  Hyperventilation did not show any EEG change.  Physiologic photic driving was not seen during photic stimulation.  IMPRESSION: This study is within normal limits. No seizures or epileptiform discharges were seen throughout the recording. Charlsie Quest   CT Head Wo Contrast  Result Date: 04/18/2020 CLINICAL DATA:  Nontraumatic seizure EXAM: CT HEAD WITHOUT CONTRAST TECHNIQUE: Contiguous axial images were obtained from the base of the skull through the vertex without intravenous contrast. Sagittal and coronal MPR images reconstructed from axial data set. COMPARISON:  None FINDINGS: Brain: Normal ventricular morphology. No midline shift or mass effect. Normal appearance of brain parenchyma. No intracranial hemorrhage, mass lesion or evidence of acute infarction. No extra-axial fluid collections. Vascular: Unremarkable.  No hyperdense vessels. Skull: Intact Sinuses/Orbits: Clear Other: N/A IMPRESSION: Normal exam. Electronically Signed   By: Ulyses Southward M.D.   On: 04/18/2020 13:27   MR BRAIN WO CONTRAST  Result Date: 04/19/2020 CLINICAL DATA:  Seizure with  abnormal neuro exam. EXAM: MRI HEAD WITHOUT CONTRAST TECHNIQUE: Multiplanar, multiecho pulse sequences of the brain and surrounding structures were obtained without intravenous contrast. COMPARISON:  None. FINDINGS: Brain: No acute infarction, hemorrhage, hydrocephalus, extra-axial collection or mass lesion. No white matter disease or brain atrophy. No cortical finding to explain seizure. The hippocampus has a symmetric normal architecture and size. Vascular: Normal flow voids Skull and upper cervical spine: Patchy hypointense appearance in the C4 and C5 vertebrae on sagittal T1 weighted imaging, likely degenerative sclerosis when correlated with a 2016 cervical spine CT. Sinuses/Orbits: Negative Other: Adenoid thickening IMPRESSION: Normal appearance of the brain. Electronically Signed   By: Marnee Spring M.D.   On: 04/19/2020 11:26      Subjective: No acute issues or events overnight denies nausea, vomiting, diarrhea, constipation, headache, fever, chills.   Discharge Exam: Vitals:   04/19/20 0326 04/19/20 1150  BP: 114/76 (!) 116/99  Pulse: 84 72  Resp: 17 18  Temp: 99.3 F (37.4 C) 98.5 F (36.9 C)  SpO2: 99% 98%   Vitals:   04/18/20 2120 04/18/20 2346 04/19/20 0326 04/19/20 1150  BP: 106/66 (!) 135/43 114/76 (!) 116/99  Pulse: 77 (!) 102 84 72  Resp: Temp: 99 F (37.2 C) 100.2 F (37.9 C) 99.3 F (37.4 C) 98.5 F (36.9 C)  TempSrc: Oral Oral Oral Oral  SpO2: 100% 99% 99% 98%  Weight:      Height:        General: Pt is alert, awake, not in acute distress Cardiovascular: RRR, S1/S2 +, no rubs, no gallops Respiratory: CTA bilaterally, no  wheezing, no rhonchi Abdominal: Soft, NT, ND, bowel sounds + Extremities: no edema, no cyanosis    The results of significant diagnostics from this hospitalization (including imaging, microbiology, ancillary and laboratory) are listed below for reference.     Microbiology: Recent Results (from the past 240 hour(s))   Respiratory Panel by RT PCR (Flu A&B, Covid) - Nasopharyngeal Swab     Status: None   Collection Time: 04/18/20  4:12 PM   Specimen: Nasopharyngeal Swab  Result Value Ref Range Status   SARS Coronavirus 2 by RT PCR NEGATIVE NEGATIVE Final    Comment: (NOTE) SARS-CoV-2 target nucleic acids are NOT DETECTED.  The SARS-CoV-2 RNA is generally detectable in upper respiratoy specimens during the acute phase of infection. The lowest concentration of SARS-CoV-2 viral copies this assay can detect is 131 copies/mL. A negative result does not preclude SARS-Cov-2 infection and should not be used as the sole basis for treatment or other patient management decisions. A negative result may occur with  improper specimen collection/handling, submission of specimen other than nasopharyngeal swab, presence of viral mutation(s) within the areas targeted by this assay, and inadequate number of viral copies (<131 copies/mL). A negative result must be combined with clinical observations, patient history, and epidemiological information. The expected result is Negative.  Fact Sheet for Patients:  https://www.moore.com/  Fact Sheet for Healthcare Providers:  https://www.young.biz/  This test is no t yet approved or cleared by the [place] FDA and  has been authorized for detection and/or diagnosis of SARS-CoV-2 by FDA under an Emergency Use Authorization (EUA). This EUA will remain  in effect (meaning this test can be used) for the duration of the COVID-19 declaration under Section 564(b)(1) of the Act, 21 U.S.C. section 360bbb-3(b)(1), unless the authorization is terminated or revoked sooner.     Influenza A by PCR NEGATIVE NEGATIVE Final   Influenza B by PCR NEGATIVE NEGATIVE Final    Comment: (NOTE) The Xpert Xpress SARS-CoV-2/FLU/RSV assay is intended as an aid in  the diagnosis of influenza from Nasopharyngeal swab specimens and  should not be used as  a sole basis for treatment. Nasal washings and  aspirates are unacceptable for Xpert Xpress SARS-CoV-2/FLU/RSV  testing.  Fact Sheet for Patients: https://www.moore.com/  Fact Sheet for Healthcare Providers: https://www.young.biz/  This test is not yet approved or cleared by the [place] FDA and  has been authorized for detection and/or diagnosis of SARS-CoV-2 by  FDA under an Emergency Use Authorization (EUA). This EUA will remain  in effect (meaning this test can be used) for the duration of the  Covid-19 declaration under Section 564(b)(1) of the Act, 21  U.S.C. section 360bbb-3(b)(1), unless the authorization is  terminated or revoked. Performed at [facility], [address] [place], [place] [address]      Labs: BNP (last 3 results) No results for input(s): BNP in the last 8760 hours. Basic Metabolic Panel: Recent Labs  Lab 04/18/20 1135 04/18/20 1244 04/19/20 0254  NA 138 138 139  K 4.0 4.2 3.4*  CL 102 102 107  CO2 27  --  24  GLUCOSE 104* 108* 79  BUN 13 12 10   CREATININE 0.83 1.00 0.87  CALCIUM 9.3  --  9.1  MG 2.4  --   --    Liver Function Tests: Recent Labs  Lab 04/18/20 1135  AST 26  ALT 21  ALKPHOS 67  BILITOT 0.2*  PROT 7.1  ALBUMIN 4.2   No results for input(s):  LIPASE, AMYLASE in the last 168 hours. No results for input(s): AMMONIA in the last 168 hours. CBC: Recent Labs  Lab 04/18/20 1135 04/18/20 1244 04/19/20 0203  WBC 13.1*  --  13.7*  NEUTROABS 8.6*  --   --   HGB 13.6 13.3 13.1  HCT 40.6 39.0 38.9  MCV 93.1  --  92.0  PLT 321  --  318   Cardiac Enzymes: No results for input(s): CKTOTAL, CKMB, CKMBINDEX, TROPONINI in the last 168 hours. BNP: Invalid input(s): POCBNP CBG: Recent Labs  Lab 04/18/20 1418  GLUCAP 137*   D-Dimer No results for input(s): DDIMER in the last 72 hours. Hgb A1c No results for input(s): HGBA1C in the last 72 hours. Lipid Profile No  results for input(s): CHOL, HDL, LDLCALC, TRIG, CHOLHDL, LDLDIRECT in the last 72 hours. Thyroid function studies Recent Labs    04/19/20 0254  TSH 0.567   Anemia work up No results for input(s): VITAMINB12, FOLATE, FERRITIN, TIBC, IRON, RETICCTPCT in the last 72 hours. Urinalysis    Component Value Date/Time   COLORURINE YELLOW 04/18/2020 1334   APPEARANCEUR CLEAR 04/18/2020 1334   LABSPEC 1.020 04/18/2020 1334   PHURINE 7.0 04/18/2020 1334   GLUCOSEU NEGATIVE 04/18/2020 1334   HGBUR SMALL (A) 04/18/2020 1334   BILIRUBINUR NEGATIVE 04/18/2020 1334   KETONESUR NEGATIVE 04/18/2020 1334   PROTEINUR NEGATIVE 04/18/2020 1334   NITRITE NEGATIVE 04/18/2020 1334   LEUKOCYTESUR NEGATIVE 04/18/2020 1334   Sepsis Labs Invalid input(s): PROCALCITONIN,  WBC,  LACTICIDVEN Microbiology Recent Results (from the past 240 hour(s))  Respiratory Panel by RT PCR (Flu A&B, Covid) - Nasopharyngeal Swab     Status: None   Collection Time: 04/18/20  4:12 PM   Specimen: Nasopharyngeal Swab  Result Value Ref Range Status   SARS Coronavirus 2 by RT PCR NEGATIVE NEGATIVE Final    Comment: (NOTE) SARS-CoV-2 target nucleic acids are NOT DETECTED.  The SARS-CoV-2 RNA is generally detectable in upper respiratoy specimens during the acute phase of infection. The lowest concentration of SARS-CoV-2 viral copies this assay can detect is 131 copies/mL. A negative result does not preclude SARS-Cov-2 infection and should not be used as the sole basis for treatment or other patient management decisions. A negative result may occur with  improper specimen collection/handling, submission of specimen other than nasopharyngeal swab, presence of viral mutation(s) within the areas targeted by this assay, and inadequate number of viral copies (<131 copies/mL). A negative result must be combined with clinical observations, patient history, and epidemiological information. The expected result is Negative.  Fact  Sheet for Patients:  https://www.moore.com/  Fact Sheet for Healthcare Providers:  https://www.young.biz/  This test is no t yet approved or cleared by the [place] FDA and  has been authorized for detection and/or diagnosis of SARS-CoV-2 by FDA under an Emergency Use Authorization (EUA). This EUA will remain  in effect (meaning this test can be used) for the duration of the COVID-19 declaration under Section 564(b)(1) of the Act, 21 U.S.C. section 360bbb-3(b)(1), unless the authorization is terminated or revoked sooner.     Influenza A by PCR NEGATIVE NEGATIVE Final   Influenza B by PCR NEGATIVE NEGATIVE Final    Comment: (NOTE) The Xpert Xpress SARS-CoV-2/FLU/RSV assay is intended as an aid in  the diagnosis of influenza from Nasopharyngeal swab specimens and  should not be used as a sole basis for treatment. Nasal washings and  aspirates are unacceptable for Xpert Xpress SARS-CoV-2/FLU/RSV  testing.  Fact Sheet for  Patients: https://www.moore.com/  Fact Sheet for Healthcare Providers: https://www.young.biz/  This test is not yet approved or cleared by the [place] FDA and  has been authorized for detection and/or diagnosis of SARS-CoV-2 by  FDA under an Emergency Use Authorization (EUA). This EUA will remain  in effect (meaning this test can be used) for the duration of the  Covid-19 declaration under Section 564(b)(1) of the Act, 21  U.S.C. section 360bbb-3(b)(1), unless the authorization is  terminated or revoked. Performed at [facility], [address] [place], [place] [address]      Time coordinating discharge: Over 30 minutes  SIGNED:   Azucena Fallen, DO [facility] 04/19/2020, 2:14 PM Pager   If 7PM-7AM, please contact night-coverage [email or web address]

## 2020-04-19 NOTE — Progress Notes (Signed)
EEG complete - results pending 

## 2022-04-26 IMAGING — CT CT HEAD W/O CM
3 series · 16 of 47 positions shown, 19 images · non-contrast
Comparison: None

CLINICAL DATA: Nontraumatic seizure

EXAM:
CT HEAD WITHOUT CONTRAST
TECHNIQUE: Contiguous axial images were obtained from the base of the skull
through the vertex without intravenous contrast. Sagittal and
coronal MPR images reconstructed from axial data set.

[Series 2: head wo · axial · 0.44mm/px · z∈[+587,+732]mm · 10 of 35 slices shown, 13 images]
[im 3/35  brain]
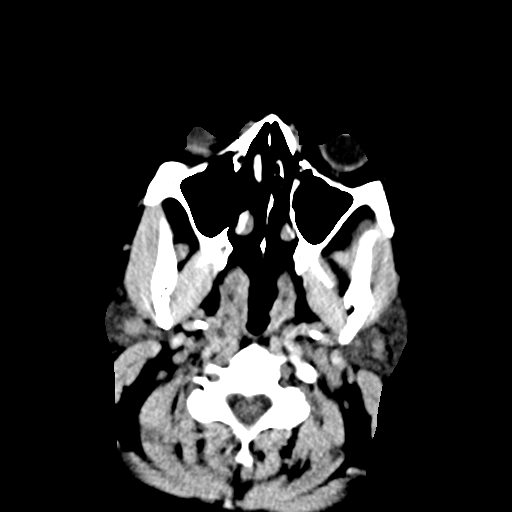
[im 3/35  bone]
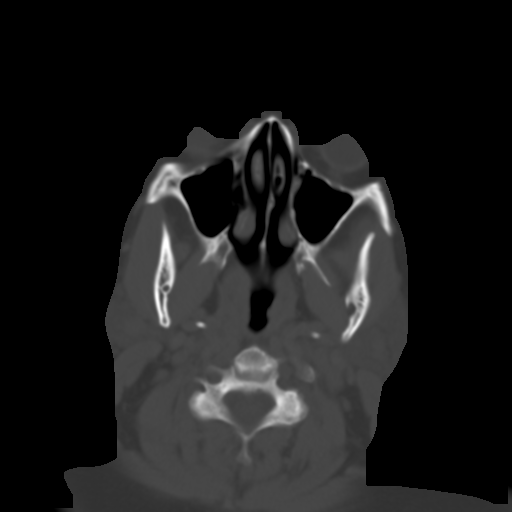
[im 6/35  brain]
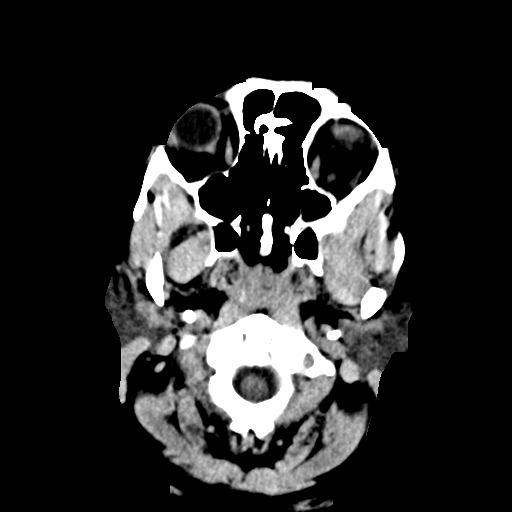
[im 10/35  brain]
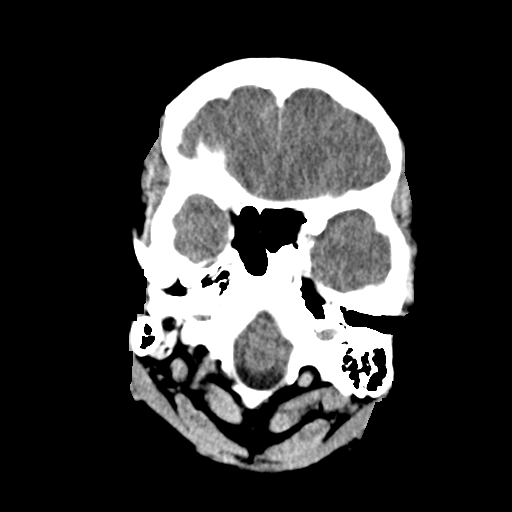
[im 12/35  brain]
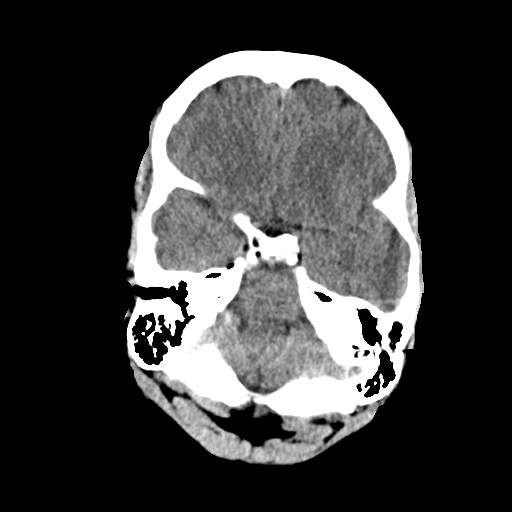
[im 16/35  brain]
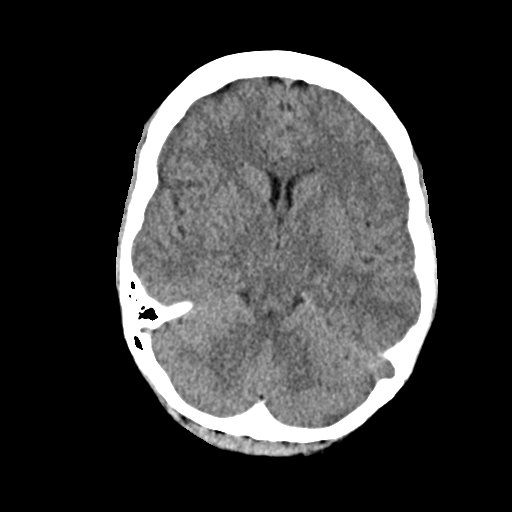
[im 16/35  bone]
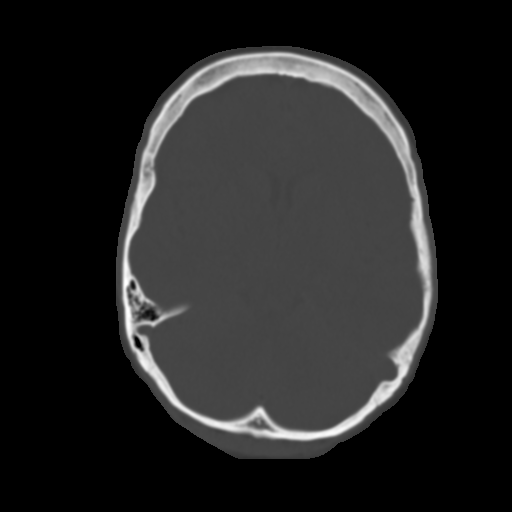
[im 19/35  brain]
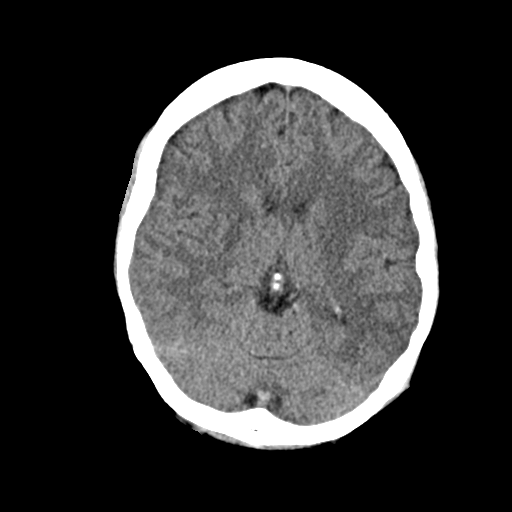
[im 23/35  brain]
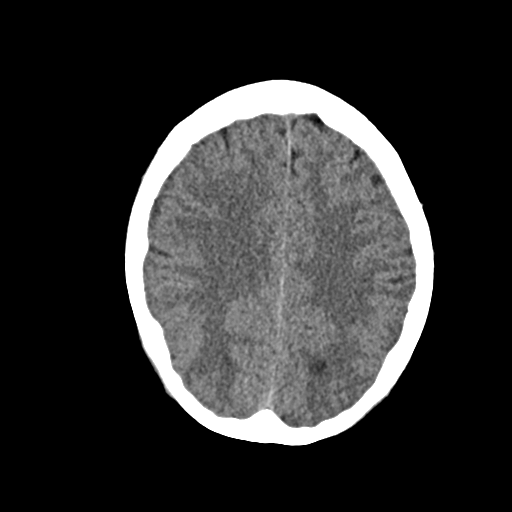
[im 26/35  brain]
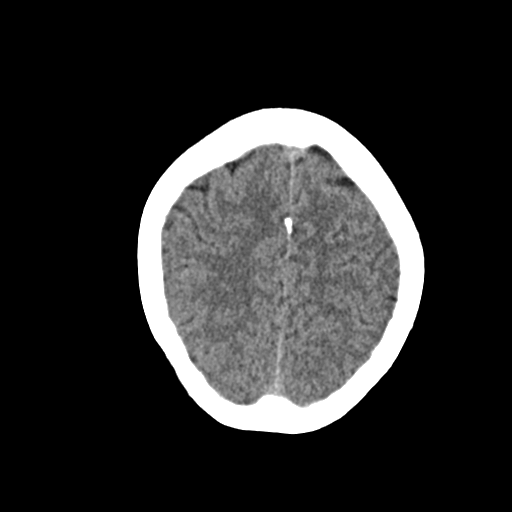
[im 29/35  brain]
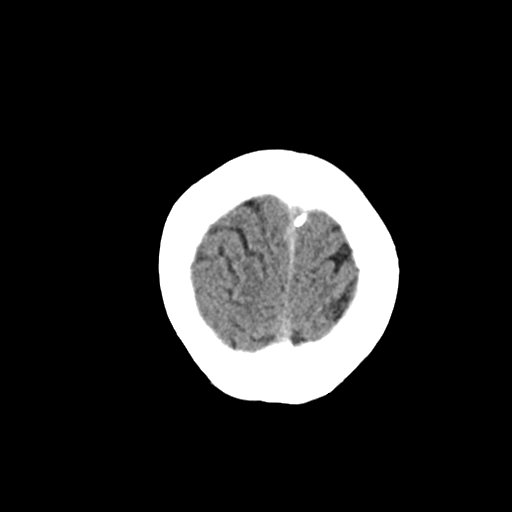
[im 29/35  bone]
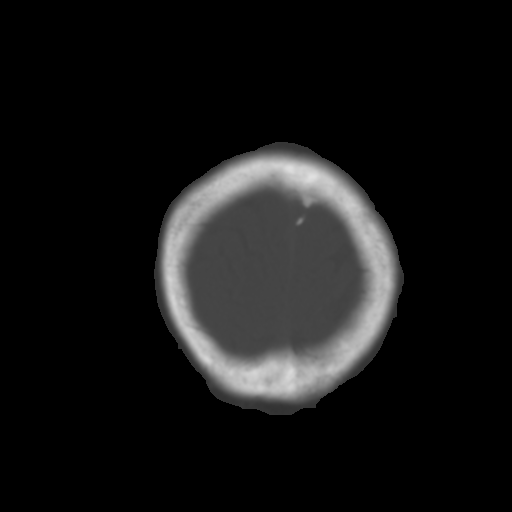
[im 32/35  brain]
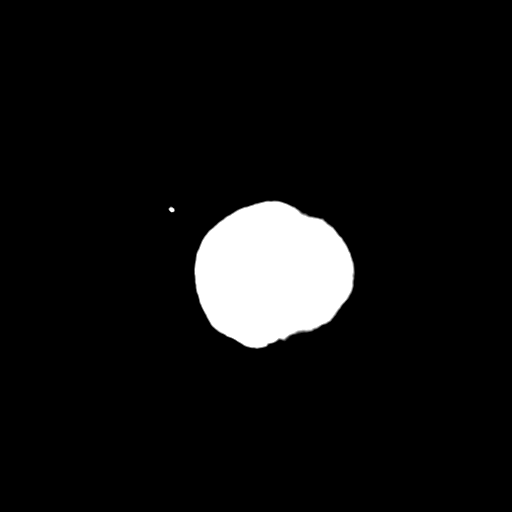

[Series 4: coronal soft · coronal · 0.37mm/px · 3 of 70 slices shown]
[im 25/70  brain]
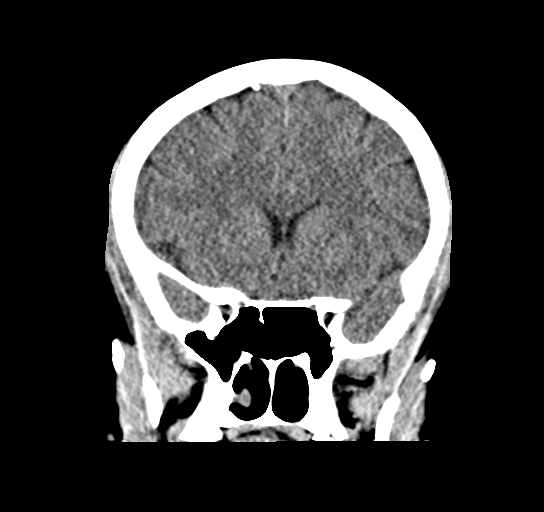
[im 32/70  brain]
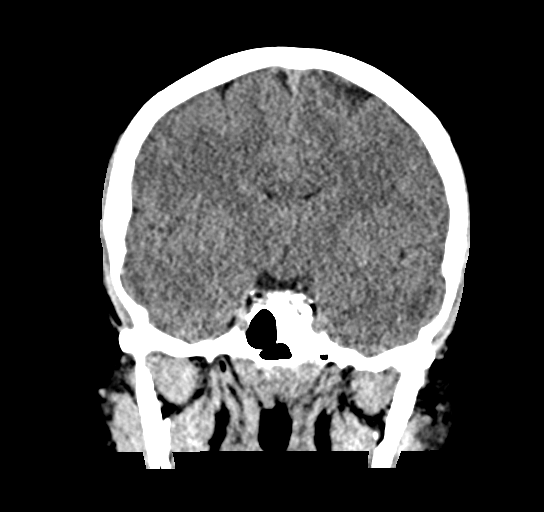
[im 38/70  brain]
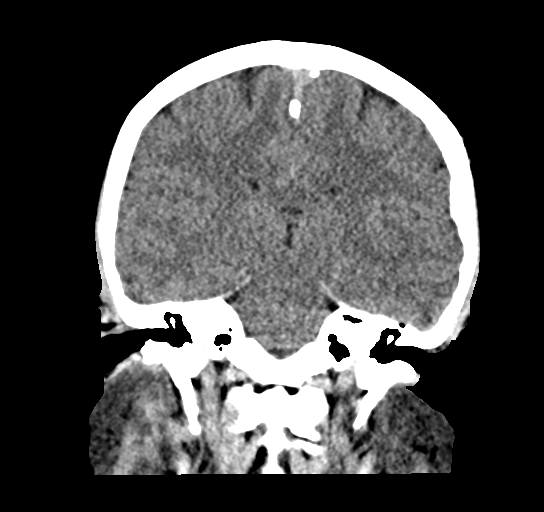

[Series 5: sag soft · sagittal · 0.36mm/px · 3 of 58 slices shown]
[im 20/58  brain]
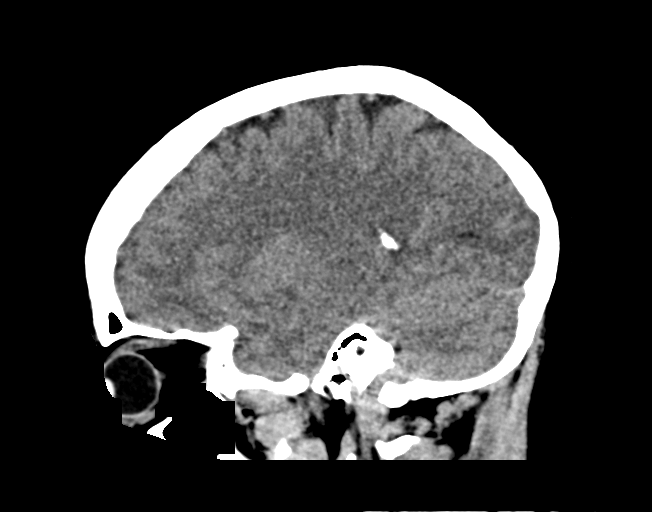
[im 29/58  brain]
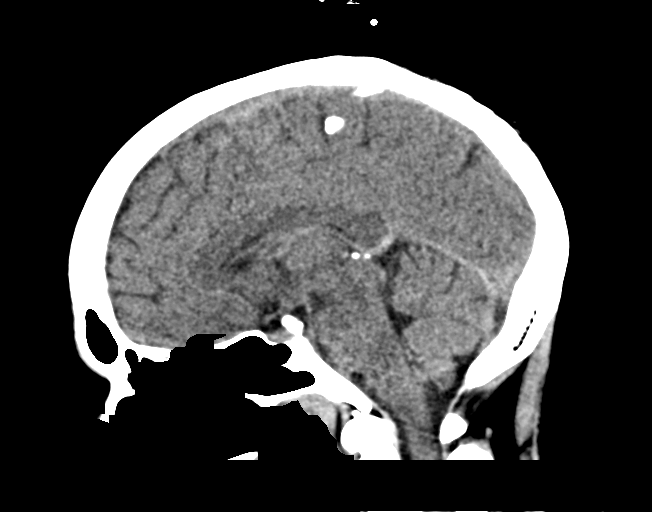
[im 39/58  brain]
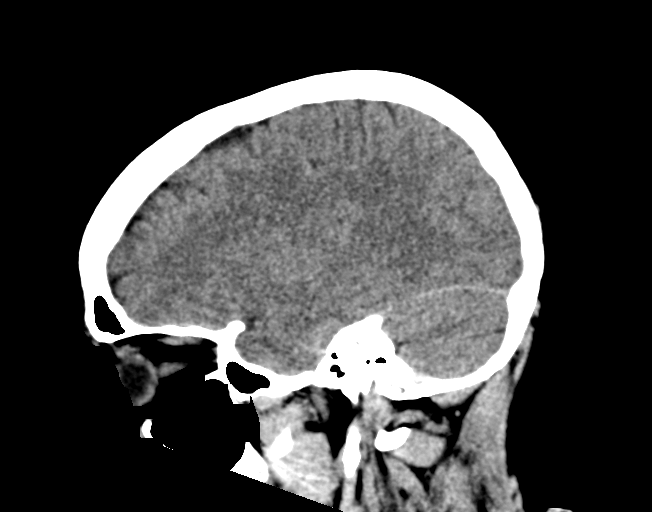

[16 of 47 positions shown; findings below may reference images not displayed]

FINDINGS: Brain: Normal ventricular morphology. No midline shift or mass
effect. Normal appearance of brain parenchyma. No intracranial
hemorrhage, mass lesion or evidence of acute infarction. No
extra-axial fluid collections.

Vascular: Unremarkable.  No hyperdense vessels.

Skull: Intact

Sinuses/Orbits: Clear

Other: N/A
IMPRESSION: Normal exam.

## 2023-08-11 ENCOUNTER — Emergency Department (HOSPITAL_BASED_OUTPATIENT_CLINIC_OR_DEPARTMENT_OTHER): Payer: Medicaid Other

## 2023-08-11 ENCOUNTER — Emergency Department (HOSPITAL_BASED_OUTPATIENT_CLINIC_OR_DEPARTMENT_OTHER)
Admission: EM | Admit: 2023-08-11 | Discharge: 2023-08-11 | Disposition: A | Discharge status: Home / Self Care | Payer: Medicaid Other | Attending: Emergency Medicine | Admitting: Emergency Medicine

## 2023-08-11 ENCOUNTER — Encounter (HOSPITAL_BASED_OUTPATIENT_CLINIC_OR_DEPARTMENT_OTHER): Payer: Self-pay

## 2023-08-11 DIAGNOSIS — M542 Cervicalgia: Secondary | ICD-10-CM | POA: Diagnosis not present

## 2023-08-11 DIAGNOSIS — M25511 Pain in right shoulder: Secondary | ICD-10-CM | POA: Insufficient documentation

## 2023-08-11 DIAGNOSIS — Y92410 Unspecified street and highway as the place of occurrence of the external cause: Secondary | ICD-10-CM | POA: Diagnosis not present

## 2023-08-11 MED ORDER — CYCLOBENZAPRINE HCL 10 MG PO TABS: 10.0000 mg | ORAL_TABLET | Freq: Two times a day (BID) | ORAL | 0 refills | Status: AC | PRN

## 2023-08-11 MED ORDER — ACETAMINOPHEN 325 MG PO TABS
650.0000 mg | ORAL_TABLET | Freq: Once | ORAL | Status: AC
  Administered 2023-08-11: 650 mg via ORAL
  Filled 2023-08-11: qty 2
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
## 2023-08-11 NOTE — Discharge Instructions (Signed)
As discussed, imaging of your neck and right shoulder appeared normal.  No obvious fracture dislocation bony aspects of either area.  Will recommend treatment of pain at home with Tylenol/Motrin.  Will send in muscle laxer to use as needed but not this medication can cause drowsiness so please do not take while driving or performing any other high risk activity.  Expect pain to be worse over the next day or 2 before begins to get better.  Please do not hesitate to return to emergency department if the worrisome signs and symptoms we discussed become apparent.
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
